# Patient Record
Sex: Female | Born: 1995 | Race: White | Hispanic: No | Marital: Married | Smoking: Former smoker
Health system: Southern US, Community
[De-identification: ages and names within clinical notes are randomized; demographics above are authoritative.]

## PROBLEM LIST (undated history)

## (undated) DIAGNOSIS — R51 Headache: Secondary | ICD-10-CM

## (undated) DIAGNOSIS — N390 Urinary tract infection, site not specified: Secondary | ICD-10-CM

## (undated) DIAGNOSIS — M419 Scoliosis, unspecified: Secondary | ICD-10-CM

## (undated) DIAGNOSIS — F419 Anxiety disorder, unspecified: Secondary | ICD-10-CM

## (undated) DIAGNOSIS — K219 Gastro-esophageal reflux disease without esophagitis: Secondary | ICD-10-CM

## (undated) DIAGNOSIS — F988 Other specified behavioral and emotional disorders with onset usually occurring in childhood and adolescence: Secondary | ICD-10-CM

## (undated) DIAGNOSIS — F509 Eating disorder, unspecified: Secondary | ICD-10-CM

## (undated) DIAGNOSIS — F329 Major depressive disorder, single episode, unspecified: Secondary | ICD-10-CM

## (undated) DIAGNOSIS — F32A Depression, unspecified: Secondary | ICD-10-CM

## (undated) DIAGNOSIS — Z789 Other specified health status: Secondary | ICD-10-CM

## (undated) HISTORY — DX: Gastro-esophageal reflux disease without esophagitis: K21.9

## (undated) HISTORY — DX: Other specified behavioral and emotional disorders with onset usually occurring in childhood and adolescence: F98.8

---
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
## 2015-01-22 ENCOUNTER — Other Ambulatory Visit: Payer: BLUE CROSS/BLUE SHIELD

## 2015-01-22 DIAGNOSIS — Z0000 Encounter for general adult medical examination without abnormal findings: Secondary | ICD-10-CM

## 2015-01-23 LAB — CBC WITH DIFFERENTIAL
Basophils Absolute: 0 10*3/uL (ref 0.0–0.2)
Basos: 0 %
Eos: 0 %
Eosinophils Absolute: 0 10*3/uL (ref 0.0–0.4)
HCT: 41.4 % (ref 34.0–46.6)
Hemoglobin: 13.4 g/dL (ref 11.1–15.9)
Immature Grans (Abs): 0 10*3/uL (ref 0.0–0.1)
Immature Granulocytes: 0 %
Lymphocytes Absolute: 3.4 10*3/uL — ABNORMAL HIGH (ref 0.7–3.1)
Lymphs: 41 %
MCH: 29.2 pg (ref 26.6–33.0)
MCHC: 32.4 g/dL (ref 31.5–35.7)
MCV: 90 fL (ref 79–97)
Monocytes Absolute: 0.5 10*3/uL (ref 0.1–0.9)
Monocytes: 7 %
Neutrophils Absolute: 4.2 10*3/uL (ref 1.4–7.0)
Neutrophils Relative %: 52 %
RBC: 4.59 x10E6/uL (ref 3.77–5.28)
RDW: 13.5 % (ref 12.3–15.4)
WBC: 8.2 10*3/uL (ref 3.4–10.8)

## 2015-01-23 LAB — COMPREHENSIVE METABOLIC PANEL
ALT: 12 IU/L (ref 0–32)
AST: 19 IU/L (ref 0–40)
Albumin/Globulin Ratio: 1.7 (ref 1.1–2.5)
Albumin: 4.6 g/dL (ref 3.5–5.5)
Alkaline Phosphatase: 67 IU/L (ref 43–101)
BUN/Creatinine Ratio: 14 (ref 8–20)
BUN: 10 mg/dL (ref 6–20)
Bilirubin Total: 0.3 mg/dL (ref 0.0–1.2)
CO2: 20 mmol/L (ref 18–29)
Calcium: 9.8 mg/dL (ref 8.7–10.2)
Chloride: 102 mmol/L (ref 97–108)
Creatinine, Ser: 0.72 mg/dL (ref 0.57–1.00)
GFR calc Af Amer: 141 mL/min/{1.73_m2} (ref >59)
GFR calc non Af Amer: 123 mL/min/{1.73_m2} (ref >59)
Globulin, Total: 2.7 g/dL (ref 1.5–4.5)
Glucose: 83 mg/dL (ref 65–99)
Potassium: 4.3 mmol/L (ref 3.5–5.2)
Sodium: 141 mmol/L (ref 134–144)
Total Protein: 7.3 g/dL (ref 6.0–8.5)

## 2015-01-23 LAB — LIPID PANEL
Chol/HDL Ratio: 2 ratio units (ref 0.0–4.4)
Cholesterol, Total: 146 mg/dL (ref 100–169)
HDL: 73 mg/dL (ref >39)
LDL Calculated: 61 mg/dL (ref 0–109)
Triglycerides: 59 mg/dL (ref 0–89)
VLDL Cholesterol Cal: 12 mg/dL (ref 5–40)

## 2015-01-24 ENCOUNTER — Ambulatory Visit (INDEPENDENT_AMBULATORY_CARE_PROVIDER_SITE_OTHER): Payer: BLUE CROSS/BLUE SHIELD | Admitting: Internal Medicine

## 2015-01-24 ENCOUNTER — Encounter: Payer: Self-pay | Admitting: Internal Medicine

## 2015-01-24 VITALS — BP 102/74 | HR 101 | Temp 98.0°F | Resp 20 | Ht <= 58 in | Wt 125.4 lb

## 2015-01-24 DIAGNOSIS — N926 Irregular menstruation, unspecified: Secondary | ICD-10-CM | POA: Diagnosis not present

## 2015-01-24 DIAGNOSIS — Z7251 High risk heterosexual behavior: Secondary | ICD-10-CM | POA: Diagnosis not present

## 2015-01-24 DIAGNOSIS — R300 Dysuria: Secondary | ICD-10-CM

## 2015-01-24 DIAGNOSIS — F319 Bipolar disorder, unspecified: Secondary | ICD-10-CM

## 2015-01-24 LAB — POCT URINALYSIS DIPSTICK
Bilirubin, UA: NEGATIVE
Glucose, UA: NEGATIVE
Ketones, UA: NEGATIVE
Nitrite, UA: NEGATIVE
Protein, UA: NEGATIVE
Spec Grav, UA: 1.015
Urobilinogen, UA: 0.2
pH, UA: 7

## 2015-01-24 MED ORDER — NORGESTIMATE-ETH ESTRADIOL 0.25-35 MG-MCG PO TABS: 1.0000 | ORAL_TABLET | Freq: Every day | ORAL | Status: DC
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
## 2015-01-24 NOTE — Patient Instructions (Signed)

## 2015-01-25 LAB — HCG, SERUM, QUALITATIVE: hCG,Beta Subunit,Qual,Serum: NEGATIVE m[IU]/mL (ref <6)

## 2015-01-25 LAB — HEPATITIS PANEL, ACUTE
Hep A IgM: NEGATIVE
Hep B C IgM: NEGATIVE
Hep C Virus Ab: <0.1 s/co ratio (ref 0.0–0.9)
Hepatitis B Surface Ag: NEGATIVE

## 2015-01-25 LAB — HIV ANTIBODY (ROUTINE TESTING W REFLEX): HIV Screen 4th Generation wRfx: NONREACTIVE

## 2015-01-26 LAB — URINE CULTURE

## 2015-01-27 LAB — GC/CHLAMYDIA PROBE AMP
Chlamydia trachomatis, NAA: POSITIVE — AB
Neisseria gonorrhoeae by PCR: NEGATIVE

## 2015-01-29 ENCOUNTER — Telehealth: Payer: Self-pay

## 2015-01-29 MED ORDER — AZITHROMYCIN 1 G PO PACK: 1.0000 g | PACK | Freq: Once | ORAL | Status: DC

## 2015-01-29 NOTE — Telephone Encounter (Signed)
-----   Message from Kermit Baloiffany L Reed, DO sent at 01/27/2015  1:23 PM EDT ----- Please call Chelsea Thomas and inform her:  Chlamydia test was positive.  She can take zithromax 1000mg  once to treat this.  If she does not take it properly, she can develop pelvic inflammatory disease which can be very dangerous.  I will need to retest her in 3 mos at her follow up appt.  All partners with whom she had unprotected sex should also be tested and treated appropriately.  Gonorrhea test was negative. Urine culture was negative.

## 2015-01-29 NOTE — Telephone Encounter (Signed)
Spoke with patient, patient verbalized understanding of results. RX sent to the pharmacy. Patient aware to sign DPR if ok to release information to mother.

## 2015-02-04 ENCOUNTER — Telehealth: Payer: Self-pay | Admitting: *Deleted

## 2015-02-04 NOTE — Telephone Encounter (Signed)
[facility] called and stated that she had received test results from patient's last testing and wanted to know if she has been treated and the date of treatment. Gave her the information.

## 2015-04-26 ENCOUNTER — Ambulatory Visit: Payer: BLUE CROSS/BLUE SHIELD | Admitting: Internal Medicine

## 2015-05-24 ENCOUNTER — Ambulatory Visit (INDEPENDENT_AMBULATORY_CARE_PROVIDER_SITE_OTHER): Payer: BLUE CROSS/BLUE SHIELD | Admitting: Internal Medicine

## 2015-05-24 ENCOUNTER — Encounter: Payer: Self-pay | Admitting: Internal Medicine

## 2015-05-24 VITALS — BP 116/70 | HR 74 | Temp 98.3°F | Wt 117.0 lb

## 2015-05-24 DIAGNOSIS — G43119 Migraine with aura, intractable, without status migrainosus: Secondary | ICD-10-CM

## 2015-05-24 DIAGNOSIS — M6248 Contracture of muscle, other site: Secondary | ICD-10-CM | POA: Diagnosis not present

## 2015-05-24 DIAGNOSIS — Z720 Tobacco use: Secondary | ICD-10-CM | POA: Diagnosis not present

## 2015-05-24 DIAGNOSIS — M62838 Other muscle spasm: Secondary | ICD-10-CM

## 2015-05-24 MED ORDER — ZOLMITRIPTAN 2.5 MG PO TABS: 2.5000 mg | ORAL_TABLET | Freq: Once | ORAL | Status: DC

## 2015-05-24 NOTE — Patient Instructions (Signed)
Try the zomig for the migraine.  If you are taking more than one tablet per week, please call back and I will refer you to the headache specialist.  Migraine Headache A migraine headache is an intense, throbbing pain on one or both sides of your head. A migraine can last for 30 minutes to several hours. CAUSES  The exact cause of a migraine headache is not always known. However, a migraine may be caused when nerves in the brain become irritated and release chemicals that cause inflammation. This causes pain. Certain things may also trigger migraines, such as:  Alcohol.  Smoking.  Stress.  Menstruation.  Aged cheeses.  Foods or drinks that contain nitrates, glutamate, aspartame, or tyramine.  Lack of sleep.  Chocolate.  Caffeine.  Hunger.  Physical exertion.  Fatigue.  Medicines used to treat chest pain (nitroglycerine), birth control pills, estrogen, and some blood pressure medicines. SIGNS AND SYMPTOMS  Pain on one or both sides of your head.  Pulsating or throbbing pain.  Severe pain that prevents daily activities.  Pain that is aggravated by any physical activity.  Nausea, vomiting, or both.  Dizziness.  Pain with exposure to bright lights, loud noises, or activity.  General sensitivity to bright lights, loud noises, or smells. Before you get a migraine, you may get warning signs that a migraine is coming (aura). An aura may include:  Seeing flashing lights.  Seeing bright spots, halos, or zigzag lines.  Having tunnel vision or blurred vision.  Having feelings of numbness or tingling.  Having trouble talking.  Having muscle weakness. DIAGNOSIS  A migraine headache is often diagnosed based on:  Symptoms.  Physical exam.  A CT scan or MRI of your head. These imaging tests cannot diagnose migraines, but they can help rule out other causes of headaches. TREATMENT Medicines may be given for pain and nausea. Medicines can also be given to help  prevent recurrent migraines.  HOME CARE INSTRUCTIONS  Only take over-the-counter or prescription medicines for pain or discomfort as directed by your health care provider. The use of long-term narcotics is not recommended.  Lie down in a dark, quiet room when you have a migraine.  Keep a journal to find out what may trigger your migraine headaches. For example, write down:  What you eat and drink.  How much sleep you get.  Any change to your diet or medicines.  Limit alcohol consumption.  Quit smoking if you smoke.  Get 7-9 hours of sleep, or as recommended by your health care provider.  Limit stress.  Keep lights dim if bright lights bother you and make your migraines worse. SEEK IMMEDIATE MEDICAL CARE IF:   Your migraine becomes severe.  You have a fever.  You have a stiff neck.  You have vision loss.  You have muscular weakness or loss of muscle control.  You start losing your balance or have trouble walking.  You feel faint or pass out.  You have severe symptoms that are different from your first symptoms. MAKE SURE YOU:   Understand these instructions.  Will watch your condition.  Will get help right away if you are not doing well or get worse. Document Released: 11/02/2005 Document Revised: 03/19/2014 Document Reviewed: 07/10/2013 [facility] Patient Information 2015 [employer] [place]. This information is not intended to replace advice given to you by your health care provider. Make sure you discuss any questions you have with your health care provider.
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
## 2015-06-04 ENCOUNTER — Ambulatory Visit (INDEPENDENT_AMBULATORY_CARE_PROVIDER_SITE_OTHER): Payer: BLUE CROSS/BLUE SHIELD | Admitting: Nurse Practitioner

## 2015-06-04 ENCOUNTER — Encounter: Payer: Self-pay | Admitting: Nurse Practitioner

## 2015-06-04 VITALS — BP 120/82 | HR 85 | Temp 98.5°F | Resp 20 | Ht <= 58 in | Wt 117.0 lb

## 2015-06-04 DIAGNOSIS — H60391 Other infective otitis externa, right ear: Secondary | ICD-10-CM | POA: Diagnosis not present

## 2015-06-04 DIAGNOSIS — J329 Chronic sinusitis, unspecified: Secondary | ICD-10-CM

## 2015-06-04 MED ORDER — AMOXICILLIN-POT CLAVULANATE 875-125 MG PO TABS: 1.0000 | ORAL_TABLET | Freq: Two times a day (BID) | ORAL | Status: AC

## 2015-06-04 MED ORDER — NEOMYCIN-POLYMYXIN-HC 3.5-10000-1 OT SOLN: 4.0000 [drp] | Freq: Three times a day (TID) | OTIC | Status: AC

## 2015-06-04 NOTE — Progress Notes (Signed)
Patient ID: Chelsea Thomas, female   DOB: 06/30/1996, 19 y.o.   MRN: [id number]    PCP: Bufford SpikesEED, TIFFANY, DO  No Known Allergies  Chief Complaint  Patient presents with  . Acute Visit    Head cold strted about a week ago,stuffy nose, right ear cloged , runny nose , cough     HPI: Patient is a 19 y.o. female seen in the office today due to head cold for about 1 week. Really bad ear ache 1 week ago then started having sore throat and runny nose.  Now she is coughing and other ear is clogged and has lost her voice.  No fevers.  Has not gotten any better or worse- staying about the same Tired a lot of over the counter medication, theraflu, cough drops.  Cough and ear being stopped up is the worse.   Review of Systems:  Review of Systems  Constitutional: Positive for fatigue. Negative for fever, chills, activity change and appetite change.  HENT: Positive for congestion, ear pain, postnasal drip, rhinorrhea, sinus pressure and sore throat (improved). Negative for ear discharge, facial swelling, hearing loss, mouth sores and nosebleeds.   Eyes: Negative.   Respiratory: Negative for cough and shortness of breath.   Cardiovascular: Negative for chest pain and leg swelling.    Past Medical History  Diagnosis Date  . Depression   . Urinary tract infection   . Scoliosis   . Medical history non-contributory   . Anxiety   . Eating disorder     Pt. reports   . Headache(784.0)   . Attention deficit disorder   . GERD (gastroesophageal reflux disease)    History reviewed. No pertinent past surgical history. Social History:   reports that she has been smoking Cigarettes.  She has a 1 pack-year smoking history. She has never used smokeless tobacco. She reports that she drinks alcohol. She reports that she does not use illicit drugs.  Family History  Problem Relation Age of Onset  . Depression Mother   . Mental illness Mother   . Cancer Maternal Grandfather   . Diabetes Maternal  Grandfather   . Hypertension Maternal Grandfather   . Diabetes Cousin   . Hypertension Cousin   . Drug abuse Father   . Osteoporosis Mother   . Asperger's syndrome Brother   . Scoliosis Brother     Medications: Patient's Medications  New Prescriptions   No medications on file  Previous Medications   ETONOGESTREL (NEXPLANON) 68 MG IMPL IMPLANT    1 each by Subdermal route once.   ZOLMITRIPTAN (ZOMIG) 2.5 MG TABLET    Take 1 tablet (2.5 mg total) by mouth once. May repeat in 2 hours if headache persists or recurs.  Modified Medications   No medications on file  Discontinued Medications   No medications on file     Physical Exam:  Filed Vitals:   06/04/15 1323  BP: 120/82  Pulse: 85  Temp: 98.5 F (36.9 C)  TempSrc: Oral  Resp: 20  Height: 4\' 10"  (1.473 m)  Weight: 117 lb (53.071 kg)  SpO2: 96%    Physical Exam  Constitutional: She is oriented to person, place, and time. No distress.  HENT:  Head: Normocephalic and atraumatic.  Right Ear: There is drainage (with erythema).  Nose: Nose normal.  Mouth/Throat: Oropharynx is clear and moist. No oropharyngeal exudate.  Pt is hoarse  Eyes: Conjunctivae and EOM are normal. Pupils are equal, round, and reactive to light.  Neck: Normal range of  motion. Neck supple.  Cardiovascular: Normal rate, regular rhythm and normal heart sounds.   Pulmonary/Chest: Effort normal and breath sounds normal.  Neurological: She is alert and oriented to person, place, and time.  Skin: Skin is warm and dry. She is not diaphoretic.  Psychiatric: She has a normal mood and affect.    Labs reviewed: Basic Metabolic Panel:  Recent Labs  16/10/96 1037  NA 141  K 4.3  CL 102  CO2 20  GLUCOSE 83  BUN 10  CREATININE 0.72  CALCIUM 9.8   Liver Function Tests:  Recent Labs  01/22/15 1037  AST 19  ALT 12  ALKPHOS 67  BILITOT 0.3  PROT 7.3   No results for input(s): LIPASE, AMYLASE in the last 8760 hours. No results for input(s):  AMMONIA in the last 8760 hours. CBC:  Recent Labs  01/22/15 1037  WBC 8.2  NEUTROABS 4.2  HGB 13.4  HCT 41.4  MCV 90   Lipid Panel:  Recent Labs  01/22/15 1037  CHOL 146  HDL 73  LDLCALC 61  TRIG 59  CHOLHDL 2.0   TSH: No results for input(s): TSH in the last 8760 hours. A1C: Lab Results  Component Value Date   HGBA1C 5.0 03/07/2014     Assessment/Plan 1. Sinusitis, unspecified chronicity, unspecified location -may use Claritin 10 mg daily -Delsym twice daily as needed for cough  - amoxicillin-clavulanate (AUGMENTIN) 875-125 MG per tablet; Take 1 tablet by mouth 2 (two) times daily.  Dispense: 14 tablet; Refill: 0  2. Otitis, externa, infective, right - neomycin-polymyxin-hydrocortisone (CORTISPORIN) otic solution; Place 4 drops into the right ear 3 (three) times daily.  Dispense: 10 mL; Refill: 0  Rashida Ladouceur K. Biagio Borg  [facility] [phone] 8 am - 5 pm) [phone] (after hours)

## 2015-06-04 NOTE — Patient Instructions (Signed)
Ear drops into right ear three times daily for 1 week To take Augmentin twice daily for 1 week  Use Claritin 10 mg daily Delsym twice daily as needed for cough   Sinusitis Sinusitis is redness, soreness, and inflammation of the paranasal sinuses. Paranasal sinuses are air pockets within the bones of your face (beneath the eyes, the middle of the forehead, or above the eyes). In healthy paranasal sinuses, mucus is able to drain out, and air is able to circulate through them by way of your nose. However, when your paranasal sinuses are inflamed, mucus and air can become trapped. This can allow bacteria and other germs to grow and cause infection. Sinusitis can develop quickly and last only a short time (acute) or continue over a long period (chronic). Sinusitis that lasts for more than 12 weeks is considered chronic.  CAUSES  Causes of sinusitis include:  Allergies.  Structural abnormalities, such as displacement of the cartilage that separates your nostrils (deviated septum), which can decrease the air flow through your nose and sinuses and affect sinus drainage.  Functional abnormalities, such as when the small hairs (cilia) that line your sinuses and help remove mucus do not work properly or are not present. SIGNS AND SYMPTOMS  Symptoms of acute and chronic sinusitis are the same. The primary symptoms are pain and pressure around the affected sinuses. Other symptoms include:  Upper toothache.  Earache.  Headache.  Bad breath.  Decreased sense of smell and taste.  A cough, which worsens when you are lying flat.  Fatigue.  Fever.  Thick drainage from your nose, which often is green and may contain pus (purulent).  Swelling and warmth over the affected sinuses. DIAGNOSIS  Your health care provider will perform a physical exam. During the exam, your health care provider may:  Look in your nose for signs of abnormal growths in your nostrils (nasal polyps).  Tap over the  affected sinus to check for signs of infection.  View the inside of your sinuses (endoscopy) using an imaging device that has a light attached (endoscope). If your health care provider suspects that you have chronic sinusitis, one or more of the following tests may be recommended:  Allergy tests.  Nasal culture. A sample of mucus is taken from your nose, sent to a lab, and screened for bacteria.  Nasal cytology. A sample of mucus is taken from your nose and examined by your health care provider to determine if your sinusitis is related to an allergy. TREATMENT  Most cases of acute sinusitis are related to a viral infection and will resolve on their own within 10 days. Sometimes medicines are prescribed to help relieve symptoms (pain medicine, decongestants, nasal steroid sprays, or saline sprays).  However, for sinusitis related to a bacterial infection, your health care provider will prescribe antibiotic medicines. These are medicines that will help kill the bacteria causing the infection.  Rarely, sinusitis is caused by a fungal infection. In theses cases, your health care provider will prescribe antifungal medicine. For some cases of chronic sinusitis, surgery is needed. Generally, these are cases in which sinusitis recurs more than 3 times per year, despite other treatments. HOME CARE INSTRUCTIONS   Drink plenty of water. Water helps thin the mucus so your sinuses can drain more easily.  Use a humidifier.  Inhale steam 3 to 4 times a day (for example, sit in the bathroom with the shower running).  Apply a warm, moist washcloth to your face 3 to 4 times  a day, or as directed by your health care provider.  Use saline nasal sprays to help moisten and clean your sinuses.  Take medicines only as directed by your health care provider.  If you were prescribed either an antibiotic or antifungal medicine, finish it all even if you start to feel better. SEEK IMMEDIATE MEDICAL CARE IF:  You  have increasing pain or severe headaches.  You have nausea, vomiting, or drowsiness.  You have swelling around your face.  You have vision problems.  You have a stiff neck.  You have difficulty breathing. MAKE SURE YOU:   Understand these instructions.  Will watch your condition.  Will get help right away if you are not doing well or get worse. Document Released: 11/02/2005 Document Revised: 03/19/2014 Document Reviewed: 11/17/2011 [facility] Patient Information 2015 [place], [place]. This information is not intended to replace advice given to you by your health care provider. Make sure you discuss any questions you have with your health care provider.

## 2015-09-19 ENCOUNTER — Encounter: Payer: Self-pay | Admitting: Internal Medicine

## 2015-09-19 ENCOUNTER — Ambulatory Visit: Payer: BLUE CROSS/BLUE SHIELD | Admitting: Internal Medicine

## 2015-09-30 ENCOUNTER — Ambulatory Visit (INDEPENDENT_AMBULATORY_CARE_PROVIDER_SITE_OTHER): Payer: BLUE CROSS/BLUE SHIELD | Admitting: Internal Medicine

## 2015-09-30 ENCOUNTER — Encounter: Payer: Self-pay | Admitting: Internal Medicine

## 2015-09-30 VITALS — BP 112/82 | HR 130 | Temp 98.4°F | Resp 18 | Ht <= 58 in | Wt 121.2 lb

## 2015-09-30 DIAGNOSIS — N898 Other specified noninflammatory disorders of vagina: Secondary | ICD-10-CM

## 2015-09-30 DIAGNOSIS — R000 Tachycardia, unspecified: Secondary | ICD-10-CM | POA: Diagnosis not present

## 2015-09-30 DIAGNOSIS — Z23 Encounter for immunization: Secondary | ICD-10-CM | POA: Diagnosis not present

## 2015-09-30 DIAGNOSIS — B081 Molluscum contagiosum: Secondary | ICD-10-CM | POA: Diagnosis not present

## 2015-09-30 DIAGNOSIS — Z7251 High risk heterosexual behavior: Secondary | ICD-10-CM | POA: Diagnosis not present

## 2015-09-30 MED ORDER — AZITHROMYCIN 1 G PO PACK: 1.0000 g | PACK | Freq: Once | ORAL | Status: DC

## 2015-09-30 NOTE — Progress Notes (Signed)
Patient ID: Chelsea Thomas, female   DOB: Nov 12, 1996, 19 y.o.   MRN: [id number]    Location: [facility] Provider: Gwenith Spitz. Renato Gails, D.O., C.M.D.  Code Status: full code Goals of Care: Advanced Directive information Does patient have an advance directive?: No  Chief Complaint  Patient presents with  . Acute Visit    Patients c/o Check for STDs and receive flu shot    HPI: Patient is a 19 y.o. female seen in the office today for an acute visit for a check for stds and to receive her flu vaccine.  Chelsea Thomas reports that she has been having "a lot" of unprotected sexual intercourse.  She that on Fri, little bumps appeared on her posterior genital area. When she sits, it's excruciatingly painful especially on a hard surface.  It's also burning when she pees.  Sometimes when she is walking along or sitting, large volumes of whitish-yellow, thin, odorless discharge will come out of her vagina and soak through her underwear.  She has difficulty wiping due to pain.  She's had no fever or chills and denies abdominal pain.  She wants to be tested for STDs due to her unprotected sex.  Also, she's been noting some tightness of her chest when she coughs.  Her heart rate is elevated today, but she appears very anxious.  She says it takes very little activity for her pulse to go up.    Review of Systems:  Review of Systems  Constitutional: Negative for fever, chills, weight loss and malaise/fatigue.  HENT: Negative for congestion.   Respiratory: Positive for cough and shortness of breath. Negative for sputum production and wheezing.   Cardiovascular: Positive for palpitations.       Heart racing, chest pressure  Gastrointestinal: Negative for abdominal pain.  Genitourinary: Positive for dysuria. Negative for urgency, frequency, hematuria and flank pain.       Vaginal discharge and rash  Musculoskeletal: Negative for back pain.  Skin: Positive for rash. Negative for itching.       painful    Psychiatric/Behavioral: Positive for depression. The patient is nervous/anxious.     Past Medical History  Diagnosis Date  . Depression   . Urinary tract infection   . Scoliosis   . Medical history non-contributory   . Anxiety   . Eating disorder     Pt. reports   . Headache(784.0)   . Attention deficit disorder   . GERD (gastroesophageal reflux disease)     History reviewed. No pertinent past surgical history.  No Known Allergies    Medication List       This list is accurate as of: 09/30/15  5:55 PM.  Always use your most recent med list.               azithromycin 1 G powder  Commonly known as:  ZITHROMAX  Take 1 packet (1 g total) by mouth once.     NEXPLANON 68 MG Impl implant  Generic drug:  etonogestrel  1 each by Subdermal route once.     QUEtiapine 300 MG tablet  Commonly known as:  SEROQUEL  Take 300 mg by mouth daily.        Health Maintenance  Topic Date Due  . CHLAMYDIA SCREENING  03/07/2015  . TETANUS/TDAP  09/04/2015  . INFLUENZA VACCINE  06/16/2016  . HIV Screening  Completed    Physical Exam: Filed Vitals:   09/30/15 1557  BP: 112/82  Pulse: 130  Temp: 98.4 F (36.9 C)  TempSrc: Oral  Resp: 18  Height: 4\' 10"  (1.473 m)  Weight: 121 lb 3.2 oz (54.976 kg)  SpO2: 98%   Body mass index is 25.34 kg/(m^2). Physical Exam  Constitutional: She appears well-developed and well-nourished. No distress.  Cardiovascular: Regular rhythm and normal heart sounds.   tachycardic  Pulmonary/Chest: Effort normal and breath sounds normal. No respiratory distress. She has no wheezes.  Abdominal: Soft. Bowel sounds are normal.  Genitourinary:  Vagina with ulceration on pt's right side; some greenish yellow dried discharge on posterior aspect of vagina/labia minora; erythema of vaginal wall; very pale yellow/white thin discharge poured out of vagina when speculum was inserted, exam was very painful for her; over inferior aspect of buttocks and  posterior labia majora, several raised round punctate papules present with centers  Skin: Skin is warm and dry.  Psychiatric:  Anxious and jittery, tremulous    Labs reviewed: Basic Metabolic Panel:  Recent Labs  40/98/1102/07/01 1037  NA 141  K 4.3  CL 102  CO2 20  GLUCOSE 83  BUN 10  CREATININE 0.72  CALCIUM 9.8   Liver Function Tests:  Recent Labs  01/22/15 1037  AST 19  ALT 12  ALKPHOS 67  BILITOT 0.3  PROT 7.3  ALBUMIN 4.6   No results for input(s): LIPASE, AMYLASE in the last 8760 hours. No results for input(s): AMMONIA in the last 8760 hours. CBC:  Recent Labs  01/22/15 1037  WBC 8.2  NEUTROABS 4.2  HGB 13.4  HCT 41.4  MCV 90   Lipid Panel:  Recent Labs  01/22/15 1037  CHOL 146  HDL 73  LDLCALC 61  TRIG 59  CHOLHDL 2.0   Lab Results  Component Value Date   HGBA1C 5.0 03/07/2014     Assessment/Plan 1. Molluscum contagiosum infection - appears that is what lesions are on her buttocks and labia -referred to derm for topical therapy - Ambulatory referral to Dermatology - HIV antibody  2. Vaginal discharge - suspect gonorrhea or chlamydia and hoping she does not have PID (no abdominal/pelvic pain or fever, but is tachy) -swab sent and treated prophylactically due to large amount of discharge and high suspicion - GC/Chlamydia Probe Amp - HIV antibody - azithromycin (ZITHROMAX) 1 G powder; Take 1 packet (1 g total) by mouth once.  Dispense: 1 each; Refill: 0  3. History of unprotected sex - will screen for common offenders and check HIV due to molluscum being more common in immunocompromised adults -counseled on avoiding unprotected sex and presently any sex as she's contagious and given information about molluscum and stds - Hep C Antibody - GC/Chlamydia Probe Amp - HIV antibody  4. Sinus tachycardia (HCC) -suspect this is due to her anxiety and poorly controlled bipolar disorder plus her current worries about her possible STDs  5. Need  for prophylactic vaccination and inoculation against influenza - Flu Vaccine QUAD 36+ mos PF IM (Fluarix & Fluzone Quad PF) given   Labs/tests ordered:   Orders Placed This Encounter  Procedures  . GC/Chlamydia Probe Amp  . Flu Vaccine QUAD 36+ mos PF IM (Fluarix & Fluzone Quad PF)  . Hep C Antibody  . HIV antibody  . Ambulatory referral to Dermatology    Referral Priority:  Urgent    Referral Type:  Consultation    Referral Reason:  Specialty Services Required    Requested Specialty:  Dermatology    Number of Visits Requested:  1    Next appt:  F/u 8 wks, but  pt left w/o making her appt.  Olander Friedl L. Billyjoe Go, D.O. Geriatrics [facility] [address], [place] [address] Cell Phone (Mon-Fri 8am-5pm):  [phone] On Call:  [phone] & follow prompts after 5pm & weekends Office Phone:  [phone] Office Fax:  [phone]

## 2015-09-30 NOTE — Patient Instructions (Signed)
Sexually Transmitted Disease A sexually transmitted disease (STD) is a disease or infection that may be passed (transmitted) from person to person, usually during sexual activity. This may happen by way of saliva, semen, blood, vaginal mucus, or urine. Common STDs include:  Gonorrhea.  Chlamydia.  Syphilis.  HIV and AIDS.  Genital herpes.  Hepatitis B and C.  Trichomonas.  Human papillomavirus (HPV).  Pubic lice.  Scabies.  Mites.  Bacterial vaginosis. WHAT ARE CAUSES OF STDs? An STD may be caused by bacteria, a virus, or parasites. STDs are often transmitted during sexual activity if one person is infected. However, they may also be transmitted through nonsexual means. STDs may be transmitted after:   Sexual intercourse with an infected person.  Sharing sex toys with an infected person.  Sharing needles with an infected person or using unclean piercing or tattoo needles.  Having intimate contact with the genitals, mouth, or rectal areas of an infected person.  Exposure to infected fluids during birth. WHAT ARE THE SIGNS AND SYMPTOMS OF STDs? Different STDs have different symptoms. Some people may not have any symptoms. If symptoms are present, they may include:  Painful or bloody urination.  Pain in the pelvis, abdomen, vagina, anus, throat, or eyes.  A skin rash, itching, or irritation.  Growths, ulcerations, blisters, or sores in the genital and anal areas.  Abnormal vaginal discharge with or without bad odor.  Penile discharge in men.  Fever.  Pain or bleeding during sexual intercourse.  Swollen glands in the groin area.  Yellow skin and eyes (jaundice). This is seen with hepatitis.  Swollen testicles.  Infertility.  Sores and blisters in the mouth. HOW ARE STDs DIAGNOSED? To make a diagnosis, your health care provider may:  Take a medical history.  Perform a physical exam.  Take a sample of any discharge to examine.  Swab the throat,  cervix, opening to the penis, rectum, or vagina for testing.  Test a sample of your first morning urine.  Perform blood tests.  Perform a Pap test, if this applies.  Perform a colposcopy.  Perform a laparoscopy. HOW ARE STDs TREATED? Treatment depends on the STD. Some STDs may be treated but not cured.  Chlamydia, gonorrhea, trichomonas, and syphilis can be cured with antibiotic medicine.  Genital herpes, hepatitis, and HIV can be treated, but not cured, with prescribed medicines. The medicines lessen symptoms.  Genital warts from HPV can be treated with medicine or by freezing, burning (electrocautery), or surgery. Warts may come back.  HPV cannot be cured with medicine or surgery. However, abnormal areas may be removed from the cervix, vagina, or vulva.  If your diagnosis is confirmed, your recent sexual partners need treatment. This is true even if they are symptom-free or have a negative culture or evaluation. They should not have sex until their health care providers say it is okay.  Your health care provider may test you for infection again 3 months after treatment. HOW CAN I REDUCE MY RISK OF GETTING AN STD? Take these steps to reduce your risk of getting an STD:  Use latex condoms, dental dams, and water-soluble lubricants during sexual activity. Do not use petroleum jelly or oils.  Avoid having multiple sex partners.  Do not have sex with someone who has other sex partners  Do not have sex with anyone you do not know or who is at high risk for an STD.  Avoid risky sex practices that can break your skin.  Do not have sex  if you have open sores on your mouth or skin.  Avoid drinking too much alcohol or taking illegal drugs. Alcohol and drugs can affect your judgment and put you in a vulnerable position.  Avoid engaging in oral and anal sex acts.  Get vaccinated for HPV and hepatitis. If you have not received these vaccines in the past, talk to your health care  provider about whether one or both might be right for you.  If you are at risk of being infected with HIV, it is recommended that you take a prescription medicine daily to prevent HIV infection. This is called pre-exposure prophylaxis (PrEP). You are considered at risk if:  You are a man who has sex with other men (MSM).  You are a heterosexual man or woman and are sexually active with more than one partner.  You take drugs by injection.  You are sexually active with a partner who has HIV.  Talk with your health care provider about whether you are at high risk of being infected with HIV. If you choose to begin PrEP, you should first be tested for HIV. You should then be tested every 3 months for as long as you are taking PrEP. WHAT SHOULD I DO IF I THINK I HAVE AN STD?  See your health care provider.  Tell your sexual partner(s). They should be tested and treated for any STDs.  Do not have sex until your health care provider says it is okay. WHEN SHOULD I GET IMMEDIATE MEDICAL CARE? Contact your health care provider right away if:   You have severe abdominal pain.  You are a man and notice swelling or pain in your testicles.  You are a woman and notice swelling or pain in your vagina.   This information is not intended to replace advice given to you by your health care provider. Make sure you discuss any questions you have with your health care provider.   Document Released: 01/23/2003 Document Revised: 11/23/2014 Document Reviewed: 05/23/2013 Elsevier Interactive Patient Education 2016 Elsevier Inc. Molluscum Contagiosum, Adult Molluscum contagiosum is a skin infection that can cause a rash. When molluscum contagiosum affects the genital area, it is called a sexually transmitted disease (STD). CAUSES Molluscum contagiosum is caused by a virus. The virus can spread easily from person to person through:  Skin-to-skin contact with an infected person.  Contact with an infected  object, such as a towel or clothing. RISK FACTORS You may be at higher risk for molluscum contagiosum if you:  Live in an area where the weather is moist and warm.  Have a weak body defense system (immune system). SIGNS AND SYMPTOMS The main symptom is a rash that appears 2-7 weeks after exposure to the virus. It is made up of 2-20 small, firm, dome-shaped bumps that may:  Be pink or flesh-colored.  Appear alone or in groups.  Range from the size of a pinhead to the size of a pencil eraser.  Feel smooth and waxy.  Have a pit in the middle.  Itch. The rash does not itch for most people. The bumps often appears on the genitals, thighs, face, neck, and abdomen. DIAGNOSIS  A health care provider can usually diagnose molluscum contagiosum by looking at the bumps on your skin. To confirm the diagnosis, your health care provider may scrape the bumps to collect a skin sample to examine under a microscope. TREATMENT The bumps may go away on their own, but people often have treatment to keep the virus  from infecting someone else or to keep the rash from spreading to other body parts. Treatment may include:  Surgery to remove the bumps by freezing them (cryosurgery).  A procedure to scrape off the bumps (curettage).  A procedure to remove the bumps with a laser.  Putting medicine on the bumps (topical treatment). Sometimes no treatment is needed.  HOME CARE INSTRUCTIONS  Take medicines only as directed by your health care provider.  As long as you have bumps on your skin, the infection can spread to others and to other parts of your body. To prevent this from happening:  Do not scratch the bumps.  Do not share clothing or towels with others until the bumps disappear.   Avoid close contact with others until the bumps disappear. This includes sexual contact.  Wash your hands often.  Cover the bumps with clothing or a bandage when you will be near other people. SEEK MEDICAL CARE  IF:  The bumps are spreading.  The bumps are becoming red and sore.  The bumps have not gone away after 12 months.   This information is not intended to replace advice given to you by your health care provider. Make sure you discuss any questions you have with your health care provider.   Document Released: 05/30/2014 Document Reviewed: 05/30/2014 Elsevier Interactive Patient Education [employer]

## 2015-10-01 LAB — HIV ANTIBODY (ROUTINE TESTING W REFLEX): HIV Screen 4th Generation wRfx: NONREACTIVE

## 2015-10-01 LAB — HIV-1 RNA ULTRAQUANT REFLEX TO GENTYP+

## 2015-10-01 LAB — GC/CHLAMYDIA PROBE AMP
Chlamydia trachomatis, NAA: NEGATIVE
Neisseria gonorrhoeae by PCR: NEGATIVE

## 2015-10-01 LAB — HEPATITIS C ANTIBODY: Hep C Virus Ab: <0.1 s/co ratio (ref 0.0–0.9)

## 2015-10-02 ENCOUNTER — Telehealth: Payer: Self-pay

## 2015-10-02 NOTE — Telephone Encounter (Signed)
Discussed results below with patient's mother Derrill MemoPam. Pam verbalized understanding of results. Pam will have Morrie Sheldonshley call to answer question about discharge. Patient just picked up ABX yesterday. Pam will call Dr.Holland (GYN) and Dr.Lupton ([facility]) for appointments.   Gonorrhea and chlamydia tests are also negative. Is her discharge improving at all since taking the antibiotic? If it's not by midweek, she needs to see a gynecologist anyway.

## 2016-01-13 ENCOUNTER — Ambulatory Visit: Payer: BLUE CROSS/BLUE SHIELD | Admitting: Internal Medicine

## 2016-05-18 ENCOUNTER — Ambulatory Visit: Payer: BLUE CROSS/BLUE SHIELD | Admitting: Internal Medicine

## 2016-11-05 ENCOUNTER — Ambulatory Visit: Payer: BLUE CROSS/BLUE SHIELD | Admitting: Internal Medicine

## 2017-01-08 ENCOUNTER — Encounter: Payer: Self-pay | Admitting: Internal Medicine

## 2017-01-08 ENCOUNTER — Ambulatory Visit (INDEPENDENT_AMBULATORY_CARE_PROVIDER_SITE_OTHER): Payer: BLUE CROSS/BLUE SHIELD | Admitting: Internal Medicine

## 2017-01-08 VITALS — BP 110/80 | HR 74 | Temp 98.2°F | Wt 120.0 lb

## 2017-01-08 DIAGNOSIS — Z7251 High risk heterosexual behavior: Secondary | ICD-10-CM

## 2017-01-08 DIAGNOSIS — R102 Pelvic and perineal pain: Secondary | ICD-10-CM | POA: Diagnosis not present

## 2017-01-08 DIAGNOSIS — N898 Other specified noninflammatory disorders of vagina: Secondary | ICD-10-CM | POA: Diagnosis not present
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
## 2017-01-08 NOTE — Patient Instructions (Signed)
Always use a condom when having sexual intercourse. You should ask your man to be tested first. I recommend you follow with a gynecologist for these needs.

## 2017-01-09 LAB — HEPATITIS C ANTIBODY: HCV Ab: NEGATIVE

## 2017-01-09 LAB — HERPES SIMPLEX VIRUS 1/2 (IGG), CSF

## 2017-01-09 LAB — GC/CHLAMYDIA PROBE AMP
CT Probe RNA: DETECTED — AB
GC Probe RNA: NOT DETECTED

## 2017-01-10 ENCOUNTER — Other Ambulatory Visit: Payer: Self-pay | Admitting: Internal Medicine

## 2017-01-10 DIAGNOSIS — A749 Chlamydial infection, unspecified: Secondary | ICD-10-CM

## 2017-01-10 MED ORDER — DOXYCYCLINE HYCLATE 100 MG PO TABS: 100.0000 mg | ORAL_TABLET | Freq: Two times a day (BID) | ORAL | 0 refills | Status: AC

## 2017-01-14 LAB — HSV 1/2 AB (IGM), IFA W/RFLX TITER
HSV 1 IgM Screen: NEGATIVE
HSV 2 IgM Screen: NEGATIVE

## 2017-01-29 ENCOUNTER — Telehealth: Payer: Self-pay

## 2017-01-29 NOTE — Telephone Encounter (Signed)
Message left on clinical intake voicemail:   Patient was calling to follow-up on letter mailed to her on 01/20/17 as a results of several attempts to reach patient about additional lab results and to schedule nurse visit appointment for last gardisil injection.  I called patient back, phone rung continuously, no voicemail pick-up. I will try again later

## 2017-02-01 NOTE — Telephone Encounter (Signed)
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

## 2017-02-04 ENCOUNTER — Ambulatory Visit: Payer: Self-pay

## 2017-02-05 ENCOUNTER — Ambulatory Visit: Payer: Self-pay

## 2017-02-11 ENCOUNTER — Ambulatory Visit: Payer: Self-pay

## 2018-05-23 ENCOUNTER — Encounter (HOSPITAL_COMMUNITY): Payer: Self-pay

## 2018-05-23 ENCOUNTER — Other Ambulatory Visit: Payer: Self-pay

## 2018-05-23 ENCOUNTER — Emergency Department (HOSPITAL_COMMUNITY)
Admission: EM | Admit: 2018-05-23 | Discharge: 2018-05-23 | Disposition: A | Discharge status: Home / Self Care | Payer: BLUE CROSS/BLUE SHIELD | Attending: Emergency Medicine | Admitting: Emergency Medicine

## 2018-05-23 DIAGNOSIS — F17210 Nicotine dependence, cigarettes, uncomplicated: Secondary | ICD-10-CM | POA: Diagnosis not present

## 2018-05-23 DIAGNOSIS — T7421XA Adult sexual abuse, confirmed, initial encounter: Secondary | ICD-10-CM | POA: Diagnosis present

## 2018-05-23 LAB — COMPREHENSIVE METABOLIC PANEL
ALT: 14 U/L (ref 0–44)
AST: 20 U/L (ref 15–41)
Albumin: 4.5 g/dL (ref 3.5–5.0)
Alkaline Phosphatase: 41 U/L (ref 38–126)
Anion gap: 7 (ref 5–15)
BUN: 13 mg/dL (ref 6–20)
CO2: 27 mmol/L (ref 22–32)
Calcium: 9.4 mg/dL (ref 8.9–10.3)
Chloride: 107 mmol/L (ref 98–111)
Creatinine, Ser: 0.61 mg/dL (ref 0.44–1.00)
GFR calc Af Amer: >60 mL/min (ref >60)
GFR calc non Af Amer: >60 mL/min (ref >60)
Glucose, Bld: 124 mg/dL — ABNORMAL HIGH (ref 70–99)
Potassium: 4.2 mmol/L (ref 3.5–5.1)
Sodium: 141 mmol/L (ref 135–145)
Total Bilirubin: 1 mg/dL (ref 0.3–1.2)
Total Protein: 7.6 g/dL (ref 6.5–8.1)

## 2018-05-23 LAB — RAPID HIV SCREEN (HIV 1/2 AB+AG)
HIV 1/2 Antibodies: NONREACTIVE
HIV-1 P24 Antigen - HIV24: NONREACTIVE

## 2018-05-23 LAB — GROUP A STREP BY PCR: Group A Strep by PCR: NOT DETECTED

## 2018-05-23 MED ORDER — METRONIDAZOLE 500 MG PO TABS
2000.0000 mg | ORAL_TABLET | Freq: Once | ORAL | Status: AC
  Administered 2018-05-23: 2000 mg via ORAL

## 2018-05-23 MED ORDER — PROMETHAZINE HCL 25 MG PO TABS
25.0000 mg | ORAL_TABLET | Freq: Four times a day (QID) | ORAL | Status: DC | PRN
  Administered 2018-05-23: 75 mg via ORAL

## 2018-05-23 MED ORDER — ELVITEG-COBIC-EMTRICIT-TENOFAF 150-150-200-10 MG PO TABS
1.0000 | ORAL_TABLET | Freq: Every day | ORAL | Status: DC
  Filled 2018-05-23: qty 5

## 2018-05-23 MED ORDER — AZITHROMYCIN 250 MG PO TABS
1000.0000 mg | ORAL_TABLET | Freq: Once | ORAL | Status: AC
  Administered 2018-05-23: 1000 mg via ORAL

## 2018-05-23 MED ORDER — ELVITEG-COBIC-EMTRICIT-TENOFAF 150-150-200-10 MG PO TABS: 1.0000 | ORAL_TABLET | Freq: Every day | ORAL | 0 refills | Status: DC

## 2018-05-23 MED ORDER — LIDOCAINE HCL (PF) 1 % IJ SOLN
0.9000 mL | Freq: Once | INTRAMUSCULAR | Status: AC
  Administered 2018-05-23: 2 mL

## 2018-05-23 MED ORDER — CEFTRIAXONE SODIUM 250 MG IJ SOLR
250.0000 mg | Freq: Once | INTRAMUSCULAR | Status: AC
  Administered 2018-05-23: 250 mg via INTRAMUSCULAR

## 2018-05-23 MED FILL — GENVOYA TABLET: 150-150-200 | 28 days supply | Qty: 28 | Fill #0
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
## 2018-05-23 NOTE — SANE Note (Addendum)
ON 05/23/2018, AT APPROXIMATELY 1634 HOURS, THE FOLLOWING ADVANCING ACCESS PROGRAM (AAP) CO-PAY ASSISTANCE VOUCHER NUMBERS WERE OBTAINED FOR THE HIV nPEP MEDICATION GENVOYA:    BIN:  [id number]  PCN:  [id number]  GROUP:  [id number]  ISSUER:  [id number]  ID:  [id number]  THE ABOVE VOUCHER NUMBERS WERE EMAILED TO THE [facility] ([facility]).  THE [facility] AND THE  INPATIENT PHARMACY ADVISED THAT THE ENTIRE 28-DAY REGIMEN WOULD BE HAND-DELIVERED TO THE PT, WHO WAS IN [facility] TRIAGE ROOM #4, FROM THE HIV nPEP SUPPLY OF GENVOYA AT THE [facility].  THE PT RECEIVED THE MEDICATION PRIOR TO BEING DISCHARGED.

## 2018-05-24 LAB — HEPATITIS C ANTIBODY: HCV Ab: <0.1 s/co ratio (ref 0.0–0.9)

## 2018-05-24 LAB — RPR: RPR Ser Ql: NONREACTIVE

## 2018-05-24 LAB — HEPATITIS B SURFACE ANTIGEN: Hepatitis B Surface Ag: NEGATIVE
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
## 2018-06-17 LAB — OB RESULTS CONSOLE GC/CHLAMYDIA
Chlamydia: NEGATIVE
Gonorrhea: NEGATIVE

## 2020-05-21 LAB — OB RESULTS CONSOLE RUBELLA ANTIBODY, IGM: Rubella: IMMUNE

## 2020-06-17 LAB — OB RESULTS CONSOLE GC/CHLAMYDIA
Chlamydia: NEGATIVE
Gonorrhea: NEGATIVE

## 2020-08-07 NOTE — Progress Notes (Deleted)
    SUBJECTIVE:   CHIEF COMPLAINT / HPI:   Pregnant?   Adderall, dextroamphetamine-amphetamine  Diclegis, vraylar, fluoxetine.    PERTINENT  PMH / PSH: ***  OBJECTIVE:   There were no vitals taken for this visit.  ***  ASSESSMENT/PLAN:   No problem-specific Assessment & Plan notes found for this encounter.     Sandre Kitty, MD [facility]

## 2020-08-08 ENCOUNTER — Ambulatory Visit: Payer: BLUE CROSS/BLUE SHIELD | Admitting: Family Medicine

## 2020-10-03 LAB — OB RESULTS CONSOLE HIV ANTIBODY (ROUTINE TESTING): HIV: NONREACTIVE

## 2020-12-11 LAB — OB RESULTS CONSOLE GBS: GBS: NEGATIVE

## 2020-12-16 LAB — OB RESULTS CONSOLE HEPATITIS B SURFACE ANTIGEN: Hepatitis B Surface Ag: NEGATIVE

## 2020-12-24 LAB — OB RESULTS CONSOLE GC/CHLAMYDIA
Chlamydia: POSITIVE
Gonorrhea: POSITIVE

## 2020-12-31 ENCOUNTER — Other Ambulatory Visit: Payer: Self-pay

## 2020-12-31 ENCOUNTER — Encounter (HOSPITAL_COMMUNITY): Payer: Self-pay | Admitting: Obstetrics and Gynecology

## 2020-12-31 ENCOUNTER — Inpatient Hospital Stay (HOSPITAL_COMMUNITY)
Admission: AD | Admit: 2020-12-31 | Discharge: 2020-12-31 | Disposition: A | Discharge status: Home / Self Care | Payer: Medicaid Other | Attending: Obstetrics and Gynecology | Admitting: Obstetrics and Gynecology

## 2020-12-31 DIAGNOSIS — Z0371 Encounter for suspected problem with amniotic cavity and membrane ruled out: Secondary | ICD-10-CM

## 2020-12-31 DIAGNOSIS — Z3493 Encounter for supervision of normal pregnancy, unspecified, third trimester: Secondary | ICD-10-CM | POA: Insufficient documentation

## 2020-12-31 DIAGNOSIS — Z3A39 39 weeks gestation of pregnancy: Secondary | ICD-10-CM | POA: Insufficient documentation

## 2020-12-31 LAB — POCT FERN TEST: POCT Fern Test: NEGATIVE

## 2020-12-31 NOTE — MAU Note (Signed)
Patient reports feeling something run down her leg around 1430 today, states it hasn't continued to come out since then.  Denies any VB/CTX.  Endorses + FM.

## 2020-12-31 NOTE — MAU Provider Note (Signed)
Event Date/Time   First Provider Initiated Contact with Patient 12/31/20 1559     S: Ms. Chelsea Thomas is a 25 y.o. G1P0 at [redacted]w[redacted]d  who presents to MAU today complaining of leaking of fluid x 1 episode at 1430 hours today. She denies continuous leaking of fluid. She denies vaginal bleeding. She denies contractions. She reports normal fetal movement.  She receives care with [facility].  O: BP 124/72   Pulse (!) 118   Temp 98 F (36.7 C)   Resp 15   Wt 73.1 kg   BMI 30.44 kg/m  GENERAL: Well-developed, well-nourished female in no acute distress.  HEAD: Normocephalic, atraumatic.  CHEST: Normal effort of breathing, regular heart rate ABDOMEN: Soft, nontender, gravid PELVIC: Normal external female genitalia. Vagina is pink and rugated. Cervix with normal contour, no lesions. Normal discharge.  Negative pooling.   Cervical exam: Closed   Fetal Monitoring: Baseline: 150 Variability: Mod Accelerations: 15 x 15 Decelerations: None Contractions: q 2-3, pain score 0/10  Results for orders placed or performed during the hospital encounter of 12/31/20 (from the past 24 hour(s))  Fern Test     Status: None   Collection Time: 12/31/20  5:03 PM  Result Value Ref Range   POCT Fern Test Negative = intact amniotic membranes      A: [facility] at [redacted]w[redacted]d  Membranes intact (negative pooling, negative fern)  P: Discharge home in stable condition  Calvert Cantor, [place] 12/31/2020 4:48 PM

## 2020-12-31 NOTE — Discharge Instructions (Signed)
First Stage of Labor Labor is your body's natural process of moving your baby and other structures, including the placenta and umbilical cord, out of your uterus. There are three stages of labor. How long each stage lasts is different for every woman. But certain events happen during each stage that are the same for everyone.  The first stage starts when true labor begins. This stage ends when your cervix, which is the opening from your uterus into your vagina, is completely open (dilated).  The second stage begins when your cervix is fully dilated and you start pushing. This stage ends when your baby is born.  The third stage is the delivery of the organ that nourished your baby during pregnancy (placenta). First stage of labor As your due date gets closer, you may start to notice certain physical changes that mean labor is going to start soon. You may feel that your baby has dropped lower into your pelvis. You may experience irregular, often painless, contractions that go away when you walk around or lie down (Braxton Hicks contractions). This is also called false labor. The first stage of labor begins when you start having contractions that come at regular (evenly spaced) intervals and your cervix starts to get thinner and wider in preparation for your baby to pass through. Birth care providers measure the dilation of your cervix in centimeters (cm). One centimeter is a little less than one-half of an inch. The first stage ends when your cervix is dilated to 10 cm. The first stage of labor is divided into three phases:  Early phase.  Active phase.  Transitional phase. The length of the first stage of labor varies. It may be longer if this is your first pregnancy. You may spend most of this stage at home trying to relax and stay comfortable. How does this affect me? During the first stage of labor, you will move through three phases. What happens in the early phase?  You will start to have  regular contractions that last 30-60 seconds. Contractions may come every 5-20 minutes. Keep track of your contractions and call your birth care provider.  Your water may break during this phase.  You may notice a clear or slightly bloody discharge of mucus (mucus plug) from your vagina.  Your cervix will dilate to 3-6 cm. What happens in the active phase? The active phase usually lasts 3-5 hours. You may go to the hospital or birth center around this time. During the active phase:  Your contractions will become stronger, longer, and more uncomfortable.  Your contractions may last 45-90 seconds and come every 3-5 minutes.  You may feel lower back pain.  Your birth care providers may examine your cervix and feel your belly to find the position of your baby.  You may have a monitor strapped to your belly to measure your contractions and your baby's heart rate.  You may start using your pain management options.  Your cervix may be dilated to 6 cm and may start to dilate more quickly. What happens in the transitional phase? The transitional phase typically lasts from 30 minutes to 2 hours. At the end of this phase, your cervix will be fully dilated to 10 cm. During the transitional phase:  Contractions will get stronger and longer.  Contractions may last 60-90 seconds and come less than 2 minutes apart.  You may feel hot flashes, chills, or nausea. How does this affect my baby? During the first stage of labor, your baby will   gradually move down into your birth canal. Follow these instructions at home and in the hospital or birth center:  When labor first begins, try to stay calm. You are still in the early phase. If it is night, try to get some sleep. If it is day, try to relax and save your energy. You may want to make some calls and get ready to go to the hospital or birth center.  When you are in the early phase, try these methods to help ease discomfort: ? Deep breathing and  muscle relaxation. ? Taking a walk. ? Taking a warm bath or shower.  Drink some fluids and have a light snack if you feel like it.  Keep track of your contractions.  Based on the plan you created with your birth care provider, call when your contractions indicate it is time.  If your water breaks, note the time, color, and odor of the fluid.  When you are in the active phase, do your breathing exercises and rely on your support people and your team of birth care providers.   Contact a health care provider if:  Your contractions are strong and regular.  You have lower back pain or cramping.  Your water breaks.  You lose your mucus plug. Get help right away if you:  Have a severe headache that does not go away.  Have changes in your vision.  Have severe pain in your upper belly.  Do not feel the baby move.  Have bright red bleeding. Summary  The first stage of labor starts when true labor begins, and it ends when your cervix is dilated to 10 cm.  The first stage of labor has three phases: early, active, and transitional.  Your baby moves into the birth canal during the first stage of labor.  You may have contractions that become stronger and longer. You may also lose your mucus plug and have your water break.  Call your birth care provider when your contractions are frequent and strong enough to go to the hospital or birth center. This information is not intended to replace advice given to you by your health care provider. Make sure you discuss any questions you have with your health care provider. Document Revised: 02/23/2019 Document Reviewed: 01/16/2018 Elsevier Patient Education  2021 Elsevier Inc.  

## 2021-01-03 ENCOUNTER — Inpatient Hospital Stay (EMERGENCY_DEPARTMENT_HOSPITAL)
Admission: EM | Admit: 2021-01-03 | Discharge: 2021-01-04 | Disposition: A | Discharge status: Home / Self Care | Payer: Medicaid Other | Source: Home / Self Care | Attending: Emergency Medicine | Admitting: Emergency Medicine

## 2021-01-03 ENCOUNTER — Encounter (HOSPITAL_COMMUNITY): Payer: Self-pay | Admitting: Obstetrics & Gynecology

## 2021-01-03 ENCOUNTER — Other Ambulatory Visit: Payer: Self-pay

## 2021-01-03 ENCOUNTER — Inpatient Hospital Stay (HOSPITAL_COMMUNITY)
Admission: AD | Admit: 2021-01-03 | Discharge: 2021-01-03 | Disposition: A | Discharge status: Home / Self Care | Payer: Medicaid Other | Attending: Obstetrics & Gynecology | Admitting: Obstetrics & Gynecology

## 2021-01-03 DIAGNOSIS — O471 False labor at or after 37 completed weeks of gestation: Secondary | ICD-10-CM | POA: Diagnosis present

## 2021-01-03 DIAGNOSIS — Z87891 Personal history of nicotine dependence: Secondary | ICD-10-CM | POA: Insufficient documentation

## 2021-01-03 DIAGNOSIS — Y92810 Car as the place of occurrence of the external cause: Secondary | ICD-10-CM | POA: Insufficient documentation

## 2021-01-03 DIAGNOSIS — O9A313 Physical abuse complicating pregnancy, third trimester: Secondary | ICD-10-CM | POA: Insufficient documentation

## 2021-01-03 DIAGNOSIS — Z3A39 39 weeks gestation of pregnancy: Secondary | ICD-10-CM | POA: Diagnosis not present

## 2021-01-03 DIAGNOSIS — Z3689 Encounter for other specified antenatal screening: Secondary | ICD-10-CM

## 2021-01-03 DIAGNOSIS — O368330 Maternal care for abnormalities of the fetal heart rate or rhythm, third trimester, not applicable or unspecified: Secondary | ICD-10-CM | POA: Insufficient documentation

## 2021-01-03 DIAGNOSIS — O26893 Other specified pregnancy related conditions, third trimester: Secondary | ICD-10-CM | POA: Diagnosis not present

## 2021-01-03 DIAGNOSIS — R519 Headache, unspecified: Secondary | ICD-10-CM | POA: Diagnosis not present

## 2021-01-03 DIAGNOSIS — O479 False labor, unspecified: Secondary | ICD-10-CM

## 2021-01-03 DIAGNOSIS — Z79899 Other long term (current) drug therapy: Secondary | ICD-10-CM | POA: Insufficient documentation

## 2021-01-03 DIAGNOSIS — O368130 Decreased fetal movements, third trimester, not applicable or unspecified: Secondary | ICD-10-CM

## 2021-01-03 LAB — CBC WITH DIFFERENTIAL/PLATELET
Abs Immature Granulocytes: 0.49 10*3/uL — ABNORMAL HIGH (ref 0.00–0.07)
Basophils Absolute: 0.1 10*3/uL (ref 0.0–0.1)
Basophils Relative: 0 %
Eosinophils Absolute: 0 10*3/uL (ref 0.0–0.5)
Eosinophils Relative: 0 %
HCT: 30 % — ABNORMAL LOW (ref 36.0–46.0)
Hemoglobin: 9.9 g/dL — ABNORMAL LOW (ref 12.0–15.0)
Immature Granulocytes: 3 %
Lymphocytes Relative: 16 %
Lymphs Abs: 2.9 10*3/uL (ref 0.7–4.0)
MCH: 26 pg (ref 26.0–34.0)
MCHC: 33 g/dL (ref 30.0–36.0)
MCV: 78.7 fL — ABNORMAL LOW (ref 80.0–100.0)
Monocytes Absolute: 1.1 10*3/uL — ABNORMAL HIGH (ref 0.1–1.0)
Monocytes Relative: 6 %
Neutro Abs: 13.6 10*3/uL — ABNORMAL HIGH (ref 1.7–7.7)
Neutrophils Relative %: 75 %
Platelets: 281 10*3/uL (ref 150–400)
RBC: 3.81 MIL/uL — ABNORMAL LOW (ref 3.87–5.11)
RDW: 15.3 % (ref 11.5–15.5)
WBC: 18.1 10*3/uL — ABNORMAL HIGH (ref 4.0–10.5)
nRBC: 0 % (ref 0.0–0.2)

## 2021-01-03 LAB — COMPREHENSIVE METABOLIC PANEL
ALT: 12 U/L (ref 0–44)
AST: 18 U/L (ref 15–41)
Albumin: 2.7 g/dL — ABNORMAL LOW (ref 3.5–5.0)
Alkaline Phosphatase: 188 U/L — ABNORMAL HIGH (ref 38–126)
Anion gap: 11 (ref 5–15)
BUN: <5 mg/dL — ABNORMAL LOW (ref 6–20)
CO2: 17 mmol/L — ABNORMAL LOW (ref 22–32)
Calcium: 8.5 mg/dL — ABNORMAL LOW (ref 8.9–10.3)
Chloride: 107 mmol/L (ref 98–111)
Creatinine, Ser: 0.53 mg/dL (ref 0.44–1.00)
GFR, Estimated: >60 mL/min (ref >60)
Glucose, Bld: 83 mg/dL (ref 70–99)
Potassium: 3.4 mmol/L — ABNORMAL LOW (ref 3.5–5.1)
Sodium: 135 mmol/L (ref 135–145)
Total Bilirubin: 1 mg/dL (ref 0.3–1.2)
Total Protein: 6.3 g/dL — ABNORMAL LOW (ref 6.5–8.1)

## 2021-01-03 MED ORDER — LACTATED RINGERS IV BOLUS
1000.0000 mL | Freq: Once | INTRAVENOUS | Status: AC
  Administered 2021-01-03: 1000 mL via INTRAVENOUS

## 2021-01-03 MED ORDER — ACETAMINOPHEN 325 MG PO TABS
650.0000 mg | ORAL_TABLET | Freq: Once | ORAL | Status: AC
  Administered 2021-01-04: 650 mg via ORAL
  Filled 2021-01-03: qty 2
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
## 2021-01-03 NOTE — Progress Notes (Signed)
[facility] followed up with Dr. Langston Masker via telephone regarding fetal heart rate remaining tachycardic. Orders given to transfer to MAU for continued monitoring. ED provider made aware.

## 2021-01-03 NOTE — Discharge Instructions (Signed)
Fetal Movement Counts Patient Name: ________________________________________________ Patient Due Date: ____________________  What is a fetal movement count? A fetal movement count is the number of times that you feel your baby move during a certain amount of time. This may also be called a fetal kick count. A fetal movement count is recommended for every pregnant woman. You may be asked to start counting fetal movements as early as week 28 of your pregnancy. Pay attention to when your baby is most active. You may notice your baby's sleep and wake cycles. You may also notice things that make your baby move more. You should do a fetal movement count:  When your baby is normally most active.  At the same time each day. A good time to count movements is while you are resting, after having something to eat and drink. How do I count fetal movements? 1. Find a quiet, comfortable area. Sit, or lie down on your side. 2. Write down the date, the start time and stop time, and the number of movements that you felt between those two times. Take this information with you to your health care visits. 3. Write down your start time when you feel the first movement. 4. Count kicks, flutters, swishes, rolls, and jabs. You should feel at least 10 movements. 5. You may stop counting after you have felt 10 movements, or if you have been counting for 2 hours. Write down the stop time. 6. If you do not feel 10 movements in 2 hours, contact your health care provider for further instructions. Your health care provider may want to do additional tests to assess your baby's well-being. Contact a health care provider if:  You feel fewer than 10 movements in 2 hours.  Your baby is not moving like he or she usually does. Date: ____________ Start time: ____________ Stop time: ____________ Movements: ____________ Date: ____________ Start time: ____________ Stop time: ____________ Movements: ____________ Date: ____________  Start time: ____________ Stop time: ____________ Movements: ____________ Date: ____________ Start time: ____________ Stop time: ____________ Movements: ____________ Date: ____________ Start time: ____________ Stop time: ____________ Movements: ____________ Date: ____________ Start time: ____________ Stop time: ____________ Movements: ____________ Date: ____________ Start time: ____________ Stop time: ____________ Movements: ____________ Date: ____________ Start time: ____________ Stop time: ____________ Movements: ____________ Date: ____________ Start time: ____________ Stop time: ____________ Movements: ____________ This information is not intended to replace advice given to you by your health care provider. Make sure you discuss any questions you have with your health care provider. Document Revised: 06/22/2019 Document Reviewed: 06/22/2019 Elsevier Patient Education  2021 Elsevier Inc. Signs and Symptoms of Labor Labor is the body's natural process of moving the baby and the placenta out of the uterus. The process of labor usually starts when the baby is full-term, between 37 and 40 weeks of pregnancy. Signs and symptoms that you are close to going into labor As your body prepares for labor and the birth of your baby, you may notice the following symptoms in the weeks and days before true labor starts:  Passing a small amount of thick, bloody mucus from your vagina. This is called normal bloody show or losing your mucus plug. This may happen more than a week before labor begins, or right before labor begins, as the opening of the cervix starts to widen (dilate). For some women, the entire mucus plug passes at once. For others, pieces of the mucus plug may gradually pass over several days.  Your baby moving (dropping) lower in your pelvis   to get into position for birth (lightening). When this happens, you may feel more pressure on your bladder and pelvic bone and less pressure on your ribs. This  may make it easier to breathe. It may also cause you to need to urinate more often and have problems with bowel movements.  Having "practice contractions," also called Braxton Hicks contractions or false labor. These occur at irregular (unevenly spaced) intervals that are more than 10 minutes apart. False labor contractions are common after exercise or sexual activity. They will stop if you change position, rest, or drink fluids. These contractions are usually mild and do not get stronger over time. They may feel like: ? A backache or back pain. ? Mild cramps, similar to menstrual cramps. ? Tightening or pressure in your abdomen. Other early symptoms include:  Nausea or loss of appetite.  Diarrhea.  Having a sudden burst of energy, or feeling very tired.  Mood changes.  Having trouble sleeping.   Signs and symptoms that labor has begun Signs that you are in labor may include:  Having contractions that come at regular (evenly spaced) intervals and increase in intensity. This may feel like more intense tightening or pressure in your abdomen that moves to your back. ? Contractions may also feel like rhythmic pain in your upper thighs or back that comes and goes at regular intervals. ? For first-time mothers, this change in intensity of contractions often occurs at a more gradual pace. ? Women who have given birth before may notice a more rapid progression of contraction changes.  Feeling pressure in the vaginal area.  Your water breaking (rupture of membranes). This is when the sac of fluid that surrounds your baby breaks. Fluid leaking from your vagina may be clear or blood-tinged. Labor usually starts within 24 hours of your water breaking, but it may take longer to begin. ? Some women may feel a sudden gush of fluid. ? Others notice that their underwear repeatedly becomes damp. Follow these instructions at home:  When labor starts, or if your water breaks, call your health care  provider or nurse care line. Based on your situation, they will determine when you should go in for an exam.  During early labor, you may be able to rest and manage symptoms at home. Some strategies to try at home include: ? Breathing and relaxation techniques. ? Taking a warm bath or shower. ? Listening to music. ? Using a heating pad on the lower back for pain. If you are directed to use heat:  Place a towel between your skin and the heat source.  Leave the heat on for 20-30 minutes.  Remove the heat if your skin turns bright red. This is especially important if you are unable to feel pain, heat, or cold. You may have a greater risk of getting burned.   Contact a health care provider if:  Your labor has started.  Your water breaks. Get help right away if:  You have painful, regular contractions that are 5 minutes apart or less.  Labor starts before you are [redacted] weeks along in your pregnancy.  You have a fever.  You have bright red blood coming from your vagina.  You do not feel your baby moving.  You have a severe headache with or without vision problems.  You have severe nausea, vomiting, or diarrhea.  You have chest pain or shortness of breath. These symptoms may represent a serious problem that is an emergency. Do not wait to see   if the symptoms will go away. Get medical help right away. Call your local emergency services (911 in the [place] Do not drive yourself to the hospital. Summary  Labor is your body's natural process of moving your baby and the placenta out of your uterus.  The process of labor usually starts when your baby is full-term, between 37 and 40 weeks of pregnancy.  When labor starts, or if your water breaks, call your health care provider or nurse care line. Based on your situation, they will determine when you should go in for an exam. This information is not intended to replace advice given to you by your health care provider. Make sure you discuss  any questions you have with your health care provider. Document Revised: 08/24/2020 Document Reviewed: 08/24/2020 Elsevier Patient Education  2021 Elsevier Inc.  
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
## 2021-01-03 NOTE — MAU Note (Signed)
.  Chelsea Thomas is a 25 y.o. at [redacted]w[redacted]d here in MAU reporting: She had an appointment in the office an hour ago. Her cervix was checked and states she has been cramping ever since. Denies VB or LOF. Reports no fetal movement since her cervical exam. Was 1cm.

## 2021-01-03 NOTE — MAU Provider Note (Addendum)
History     CSN: [id number]  Arrival date and time: 01/03/21 1451   Event Date/Time   First Provider Initiated Contact with Patient 01/03/21 1529      Chief Complaint  Patient presents with  . Contractions   HPI Chelsea Thomas is a 25 y.o. G1P0 at [redacted]w[redacted]d who presents with contractions & decreased fetal movement. Was in the office at 1 pm & had her cervix checked. Since then has had continued abdominal cramping and has not felt baby move at all since leaving the office.  Denies LOF or vaginal bleeding.  Is scheduled for induction on Monday. Goes to [facility].   OB History    Gravida  1   Para      Term      Preterm      AB      Living        SAB      IAB      Ectopic      Multiple      Live Births              Past Medical History:  Diagnosis Date  . Anxiety   . Attention deficit disorder   . Depression   . Eating disorder    Pt. reports   . GERD (gastroesophageal reflux disease)   . Headache(784.0)   . Medical history non-contributory   . Scoliosis   . Urinary tract infection     History reviewed. No pertinent surgical history.  Family History  Problem Relation Age of Onset  . Cancer Maternal Grandfather   . Diabetes Maternal Grandfather   . Hypertension Maternal Grandfather   . Diabetes Cousin   . Hypertension Cousin   . Depression Mother   . Mental illness Mother   . Osteoporosis Mother   . Drug abuse Father   . Asperger's syndrome Brother   . Scoliosis Brother     Social History   Tobacco Use  . Smoking status: Former Smoker    Packs/day: 0.15    Years: 2.00    Pack years: 0.30    Types: Cigarettes  . Smokeless tobacco: Never Used  Vaping Use  . Vaping Use: Never used  Substance Use Topics  . Alcohol use: Not Currently    Alcohol/week: 0.0 standard drinks    Comment: socially  . Drug use: Not Currently    Types: Marijuana    Allergies: No Known Allergies  Medications Prior to Admission  Medication Sig  Dispense Refill Last Dose  . FLUoxetine (PROZAC) 40 MG capsule Take 40 mg by mouth daily.     . QUEtiapine (SEROQUEL) 100 MG tablet Take 100 mg by mouth at bedtime.     . valACYclovir (VALTREX) 1000 MG tablet Take 1,000 mg by mouth 2 (two) times daily.       Review of Systems  Constitutional: Negative.   Gastrointestinal: Positive for abdominal pain.  Genitourinary: Negative.    Physical Exam   Blood pressure 122/74, pulse (!) 113, temperature 98.6 F (37 C), temperature source Oral, resp. rate 15, SpO2 100 %.  Physical Exam Vitals and nursing note reviewed.  Constitutional:      General: She is not in acute distress.    Appearance: Normal appearance.  HENT:     Head: Normocephalic and atraumatic.  Abdominal:     Tenderness: There is no abdominal tenderness.  Neurological:     Mental Status: She is alert.  Psychiatric:  Mood and Affect: Mood normal.        Behavior: Behavior normal.    NST:  Baseline: 150 bpm, Variability: Good {> 6 bpm), Accelerations: Reactive and Decelerations: Absent  Ctx Q3-6 minutes  MAU Course  Procedures No results found for this or any previous visit (from the past 24 hour(s)).  MDM  Patient present for labor evaluation. Cervix unchanged while here.   Reports no fetal movement x 2 hours prior to arrival. While in MAU documented 13 movements in less than 1 hour and has a reactive tracing. Patient reassured.    Assessment and Plan   1. False labor   2. Decreased fetal movements in third trimester, single or unspecified fetus   3. NST (non-stress test) reactive   4. [redacted] weeks gestation of pregnancy    -discussed reasons to return to MAU -keep scheduled induction appointment  Chelsea Thomas 01/03/2021, 3:29 PM

## 2021-01-04 DIAGNOSIS — Z3A39 39 weeks gestation of pregnancy: Secondary | ICD-10-CM

## 2021-01-04 DIAGNOSIS — R519 Headache, unspecified: Secondary | ICD-10-CM

## 2021-01-04 NOTE — Discharge Instructions (Signed)
Fetal Movement Counts Patient Name: ________________________________________________ Patient Due Date: ____________________  What is a fetal movement count? A fetal movement count is the number of times that you feel your baby move during a certain amount of time. This may also be called a fetal kick count. A fetal movement count is recommended for every pregnant woman. You may be asked to start counting fetal movements as early as week 28 of your pregnancy. Pay attention to when your baby is most active. You may notice your baby's sleep and wake cycles. You may also notice things that make your baby move more. You should do a fetal movement count:  When your baby is normally most active.  At the same time each day. A good time to count movements is while you are resting, after having something to eat and drink. How do I count fetal movements? 1. Find a quiet, comfortable area. Sit, or lie down on your side. 2. Write down the date, the start time and stop time, and the number of movements that you felt between those two times. Take this information with you to your health care visits. 3. Write down your start time when you feel the first movement. 4. Count kicks, flutters, swishes, rolls, and jabs. You should feel at least 10 movements. 5. You may stop counting after you have felt 10 movements, or if you have been counting for 2 hours. Write down the stop time. 6. If you do not feel 10 movements in 2 hours, contact your health care provider for further instructions. Your health care provider may want to do additional tests to assess your baby's well-being. Contact a health care provider if:  You feel fewer than 10 movements in 2 hours.  Your baby is not moving like he or she usually does. Date: ____________ Start time: ____________ Stop time: ____________ Movements: ____________ Date: ____________ Start time: ____________ Stop time: ____________ Movements: ____________ Date: ____________  Start time: ____________ Stop time: ____________ Movements: ____________ Date: ____________ Start time: ____________ Stop time: ____________ Movements: ____________ Date: ____________ Start time: ____________ Stop time: ____________ Movements: ____________ Date: ____________ Start time: ____________ Stop time: ____________ Movements: ____________ Date: ____________ Start time: ____________ Stop time: ____________ Movements: ____________ Date: ____________ Start time: ____________ Stop time: ____________ Movements: ____________ Date: ____________ Start time: ____________ Stop time: ____________ Movements: ____________ This information is not intended to replace advice given to you by your health care provider. Make sure you discuss any questions you have with your health care provider. Document Revised: 06/22/2019 Document Reviewed: 06/22/2019 Elsevier Patient Education  2021 Elsevier Inc.  

## 2021-01-05 ENCOUNTER — Other Ambulatory Visit: Payer: Self-pay

## 2021-01-05 ENCOUNTER — Encounter (HOSPITAL_COMMUNITY): Payer: Self-pay | Admitting: Obstetrics & Gynecology

## 2021-01-05 ENCOUNTER — Inpatient Hospital Stay (EMERGENCY_DEPARTMENT_HOSPITAL)
Admission: AD | Admit: 2021-01-05 | Discharge: 2021-01-05 | Disposition: A | Discharge status: Home / Self Care | Payer: Medicaid Other | Source: Home / Self Care | Attending: Obstetrics & Gynecology | Admitting: Obstetrics & Gynecology

## 2021-01-05 DIAGNOSIS — Z3A39 39 weeks gestation of pregnancy: Secondary | ICD-10-CM

## 2021-01-05 DIAGNOSIS — O471 False labor at or after 37 completed weeks of gestation: Secondary | ICD-10-CM

## 2021-01-05 DIAGNOSIS — Z3689 Encounter for other specified antenatal screening: Secondary | ICD-10-CM

## 2021-01-05 DIAGNOSIS — O479 False labor, unspecified: Secondary | ICD-10-CM

## 2021-01-05 NOTE — MAU Provider Note (Signed)
S: Ms. Chelsea Thomas is a 25 y.o. G1P0 at [redacted]w[redacted]d  who presents to MAU today for labor evaluation.     Cervical exam by RN: no cervical change over the course of 1.5 hours  Dilation: Fingertip Effacement (%): Thick Cervical Position: Posterior Station: -3 Exam by:: Leo Rod, RNC  Fetal Monitoring: Baseline: 140 Variability: moderate Accelerations: present  Decelerations: none Contractions: irregular mild contractions   MDM Discussed patient with RN. NST reviewed.   A: SIUP at [redacted]w[redacted]d  False labor  P: Discharge home Labor precautions and kick counts included in AVS Patient to [facility]  Patient may return to MAU as needed or when in labor   Sharyon Cable, [place] 01/05/2021 6:21 AM

## 2021-01-05 NOTE — MAU Note (Signed)
Pt reports stronger contractions, denies bleeding or ROM. Reports good fetal movement

## 2021-01-05 NOTE — Discharge Instructions (Signed)

## 2021-01-06 ENCOUNTER — Inpatient Hospital Stay (HOSPITAL_COMMUNITY)
Admission: AD | Admit: 2021-01-06 | Discharge: 2021-01-08 | DRG: 788 | Disposition: A | Discharge status: Home / Self Care | Payer: Medicaid Other | Attending: Obstetrics and Gynecology | Admitting: Obstetrics and Gynecology

## 2021-01-06 ENCOUNTER — Encounter (HOSPITAL_COMMUNITY): Payer: Self-pay | Admitting: Anesthesiology

## 2021-01-06 ENCOUNTER — Encounter (HOSPITAL_COMMUNITY): Payer: Self-pay | Admitting: Obstetrics & Gynecology

## 2021-01-06 ENCOUNTER — Inpatient Hospital Stay (HOSPITAL_COMMUNITY): Payer: Medicaid Other | Admitting: Anesthesiology

## 2021-01-06 ENCOUNTER — Other Ambulatory Visit: Payer: Self-pay

## 2021-01-06 ENCOUNTER — Encounter (HOSPITAL_COMMUNITY)
Admission: AD | Disposition: A | Discharge status: Home / Self Care | Payer: Self-pay | Source: Home / Self Care | Attending: Obstetrics and Gynecology

## 2021-01-06 DIAGNOSIS — F419 Anxiety disorder, unspecified: Secondary | ICD-10-CM | POA: Diagnosis present

## 2021-01-06 DIAGNOSIS — F32A Depression, unspecified: Secondary | ICD-10-CM | POA: Diagnosis present

## 2021-01-06 DIAGNOSIS — O471 False labor at or after 37 completed weeks of gestation: Secondary | ICD-10-CM | POA: Diagnosis not present

## 2021-01-06 DIAGNOSIS — O99344 Other mental disorders complicating childbirth: Secondary | ICD-10-CM | POA: Diagnosis present

## 2021-01-06 DIAGNOSIS — A6000 Herpesviral infection of urogenital system, unspecified: Secondary | ICD-10-CM | POA: Diagnosis present

## 2021-01-06 DIAGNOSIS — O9832 Other infections with a predominantly sexual mode of transmission complicating childbirth: Principal | ICD-10-CM | POA: Diagnosis present

## 2021-01-06 DIAGNOSIS — Z87891 Personal history of nicotine dependence: Secondary | ICD-10-CM | POA: Diagnosis not present

## 2021-01-06 DIAGNOSIS — Z3A39 39 weeks gestation of pregnancy: Secondary | ICD-10-CM | POA: Diagnosis not present

## 2021-01-06 DIAGNOSIS — O26893 Other specified pregnancy related conditions, third trimester: Secondary | ICD-10-CM | POA: Diagnosis present

## 2021-01-06 DIAGNOSIS — Z20822 Contact with and (suspected) exposure to covid-19: Secondary | ICD-10-CM | POA: Diagnosis present

## 2021-01-06 DIAGNOSIS — Z3A40 40 weeks gestation of pregnancy: Secondary | ICD-10-CM | POA: Diagnosis not present

## 2021-01-06 LAB — TYPE AND SCREEN
ABO/RH(D): A NEG
Antibody Screen: NEGATIVE

## 2021-01-06 LAB — CBC
HCT: 33.3 % — ABNORMAL LOW (ref 36.0–46.0)
Hemoglobin: 10.6 g/dL — ABNORMAL LOW (ref 12.0–15.0)
MCH: 25.1 pg — ABNORMAL LOW (ref 26.0–34.0)
MCHC: 31.8 g/dL (ref 30.0–36.0)
MCV: 78.9 fL — ABNORMAL LOW (ref 80.0–100.0)
Platelets: 292 10*3/uL (ref 150–400)
RBC: 4.22 MIL/uL (ref 3.87–5.11)
RDW: 15.2 % (ref 11.5–15.5)
WBC: 14.2 10*3/uL — ABNORMAL HIGH (ref 4.0–10.5)
nRBC: 0 % (ref 0.0–0.2)

## 2021-01-06 LAB — RESP PANEL BY RT-PCR (FLU A&B, COVID) ARPGX2
Influenza A by PCR: NEGATIVE
Influenza B by PCR: NEGATIVE
SARS Coronavirus 2 by RT PCR: NEGATIVE

## 2021-01-06 LAB — RPR: RPR Ser Ql: NONREACTIVE

## 2021-01-06 SURGERY — Surgical Case
Anesthesia: Epidural

## 2021-01-06 MED ORDER — WITCH HAZEL-GLYCERIN EX PADS: 1.0000 "application " | MEDICATED_PAD | CUTANEOUS | Status: DC | PRN

## 2021-01-06 MED ORDER — OXYCODONE-ACETAMINOPHEN 5-325 MG PO TABS: 2.0000 | ORAL_TABLET | ORAL | Status: DC | PRN

## 2021-01-06 MED ORDER — MORPHINE SULFATE (PF) 0.5 MG/ML IJ SOLN
INTRAMUSCULAR | Status: AC
  Filled 2021-01-06: qty 10

## 2021-01-06 MED ORDER — FENTANYL CITRATE (PF) 100 MCG/2ML IJ SOLN
INTRAMUSCULAR | Status: AC
  Filled 2021-01-06: qty 2

## 2021-01-06 MED ORDER — DIPHENHYDRAMINE HCL 50 MG/ML IJ SOLN: 12.5000 mg | INTRAMUSCULAR | Status: DC | PRN

## 2021-01-06 MED ORDER — HYDROMORPHONE HCL 1 MG/ML IJ SOLN: 0.2000 mg | INTRAMUSCULAR | Status: DC | PRN

## 2021-01-06 MED ORDER — PHENYLEPHRINE 40 MCG/ML (10ML) SYRINGE FOR IV PUSH (FOR BLOOD PRESSURE SUPPORT): 80.0000 ug | PREFILLED_SYRINGE | INTRAVENOUS | Status: DC | PRN

## 2021-01-06 MED ORDER — DIBUCAINE (PERIANAL) 1 % EX OINT: 1.0000 "application " | TOPICAL_OINTMENT | CUTANEOUS | Status: DC | PRN

## 2021-01-06 MED ORDER — QUETIAPINE FUMARATE 100 MG PO TABS
100.0000 mg | ORAL_TABLET | Freq: Every day | ORAL | Status: DC
  Filled 2021-01-06: qty 4
  Filled 2021-01-06 (×3): qty 1

## 2021-01-06 MED ORDER — NALBUPHINE HCL 10 MG/ML IJ SOLN: 5.0000 mg | Freq: Once | INTRAMUSCULAR | Status: DC | PRN

## 2021-01-06 MED ORDER — KETOROLAC TROMETHAMINE 30 MG/ML IJ SOLN
30.0000 mg | Freq: Once | INTRAMUSCULAR | Status: AC | PRN
  Administered 2021-01-06: 30 mg via INTRAVENOUS

## 2021-01-06 MED ORDER — ZOLPIDEM TARTRATE 5 MG PO TABS: 5.0000 mg | ORAL_TABLET | Freq: Every evening | ORAL | Status: DC | PRN

## 2021-01-06 MED ORDER — FENTANYL-BUPIVACAINE-NACL 0.5-0.125-0.9 MG/250ML-% EP SOLN
EPIDURAL | Status: DC | PRN
  Administered 2021-01-06: 12 mL/h via EPIDURAL

## 2021-01-06 MED ORDER — HYDROMORPHONE HCL 1 MG/ML IJ SOLN: 0.2500 mg | INTRAMUSCULAR | Status: DC | PRN

## 2021-01-06 MED ORDER — KETAMINE HCL 50 MG/5ML IJ SOSY
PREFILLED_SYRINGE | INTRAMUSCULAR | Status: AC
  Filled 2021-01-06: qty 5

## 2021-01-06 MED ORDER — DEXMEDETOMIDINE (PRECEDEX) IN NS 20 MCG/5ML (4 MCG/ML) IV SYRINGE
PREFILLED_SYRINGE | INTRAVENOUS | Status: AC
  Filled 2021-01-06: qty 5

## 2021-01-06 MED ORDER — DEXAMETHASONE SODIUM PHOSPHATE 10 MG/ML IJ SOLN
INTRAMUSCULAR | Status: DC | PRN
  Administered 2021-01-06: 4 mg via INTRAVENOUS

## 2021-01-06 MED ORDER — SIMETHICONE 80 MG PO CHEW
80.0000 mg | CHEWABLE_TABLET | Freq: Three times a day (TID) | ORAL | Status: DC
  Administered 2021-01-06 – 2021-01-07 (×4): 80 mg via ORAL
  Filled 2021-01-06 (×6): qty 1

## 2021-01-06 MED ORDER — ONDANSETRON HCL 4 MG/2ML IJ SOLN: 4.0000 mg | Freq: Once | INTRAMUSCULAR | Status: DC | PRN

## 2021-01-06 MED ORDER — LACTATED RINGERS IV SOLN: 500.0000 mL | Freq: Once | INTRAVENOUS | Status: DC

## 2021-01-06 MED ORDER — LIDOCAINE HCL (PF) 1 % IJ SOLN
INTRAMUSCULAR | Status: DC | PRN
  Administered 2021-01-06: 5 mL via EPIDURAL

## 2021-01-06 MED ORDER — SENNOSIDES-DOCUSATE SODIUM 8.6-50 MG PO TABS
2.0000 | ORAL_TABLET | Freq: Every day | ORAL | Status: DC
  Administered 2021-01-07 – 2021-01-08 (×2): 2 via ORAL
  Filled 2021-01-06 (×3): qty 2

## 2021-01-06 MED ORDER — COCONUT OIL OIL
1.0000 "application " | TOPICAL_OIL | Status: DC | PRN
  Filled 2021-01-06: qty 120

## 2021-01-06 MED ORDER — FENTANYL-BUPIVACAINE-NACL 0.5-0.125-0.9 MG/250ML-% EP SOLN
12.0000 mL/h | EPIDURAL | Status: DC | PRN
  Filled 2021-01-06: qty 250

## 2021-01-06 MED ORDER — OXYTOCIN BOLUS FROM INFUSION: 333.0000 mL | Freq: Once | INTRAVENOUS | Status: DC

## 2021-01-06 MED ORDER — LACTATED RINGERS IV SOLN: 500.0000 mL | INTRAVENOUS | Status: DC | PRN

## 2021-01-06 MED ORDER — FLUOXETINE HCL 20 MG PO CAPS
40.0000 mg | ORAL_CAPSULE | Freq: Every day | ORAL | Status: DC
  Administered 2021-01-06 – 2021-01-08 (×3): 40 mg via ORAL
  Filled 2021-01-06 (×3): qty 2

## 2021-01-06 MED ORDER — OXYTOCIN-SODIUM CHLORIDE 30-0.9 UT/500ML-% IV SOLN
1.0000 m[IU]/min | INTRAVENOUS | Status: DC
  Administered 2021-01-06: 2 m[IU]/min via INTRAVENOUS
  Filled 2021-01-06: qty 500

## 2021-01-06 MED ORDER — FENTANYL CITRATE (PF) 100 MCG/2ML IJ SOLN
INTRAMUSCULAR | Status: DC | PRN
  Administered 2021-01-06: 100 ug via EPIDURAL

## 2021-01-06 MED ORDER — OXYTOCIN-SODIUM CHLORIDE 30-0.9 UT/500ML-% IV SOLN
INTRAVENOUS | Status: DC | PRN
  Administered 2021-01-06: 300 mL via INTRAVENOUS

## 2021-01-06 MED ORDER — ONDANSETRON HCL 4 MG/2ML IJ SOLN: 4.0000 mg | Freq: Three times a day (TID) | INTRAMUSCULAR | Status: DC | PRN

## 2021-01-06 MED ORDER — TETANUS-DIPHTH-ACELL PERTUSSIS 5-2.5-18.5 LF-MCG/0.5 IM SUSY
0.5000 mL | PREFILLED_SYRINGE | Freq: Once | INTRAMUSCULAR | Status: DC
  Filled 2021-01-06: qty 0.5

## 2021-01-06 MED ORDER — OXYTOCIN-SODIUM CHLORIDE 30-0.9 UT/500ML-% IV SOLN: 2.5000 [IU]/h | INTRAVENOUS | Status: AC

## 2021-01-06 MED ORDER — FLEET ENEMA 7-19 GM/118ML RE ENEM: 1.0000 | ENEMA | Freq: Every day | RECTAL | Status: DC | PRN

## 2021-01-06 MED ORDER — VALACYCLOVIR HCL 500 MG PO TABS
1000.0000 mg | ORAL_TABLET | Freq: Two times a day (BID) | ORAL | Status: DC
Start: 2021-01-06 — End: 2021-01-08
  Administered 2021-01-07 – 2021-01-08 (×5): 1000 mg via ORAL
  Filled 2021-01-06 (×5): qty 2

## 2021-01-06 MED ORDER — FENTANYL CITRATE (PF) 100 MCG/2ML IJ SOLN
100.0000 ug | INTRAMUSCULAR | Status: DC | PRN
  Administered 2021-01-06: 100 ug via INTRAVENOUS
  Filled 2021-01-06: qty 2

## 2021-01-06 MED ORDER — NALOXONE HCL 4 MG/10ML IJ SOLN
1.0000 ug/kg/h | INTRAMUSCULAR | Status: DC | PRN
  Filled 2021-01-06: qty 5

## 2021-01-06 MED ORDER — FENTANYL CITRATE (PF) 100 MCG/2ML IJ SOLN
INTRAMUSCULAR | Status: AC
  Administered 2021-01-06: 100 ug via INTRAVENOUS
  Filled 2021-01-06: qty 2

## 2021-01-06 MED ORDER — DIPHENHYDRAMINE HCL 25 MG PO CAPS
25.0000 mg | ORAL_CAPSULE | Freq: Four times a day (QID) | ORAL | Status: DC | PRN
Start: 2021-01-06 — End: 2021-01-08

## 2021-01-06 MED ORDER — SODIUM CHLORIDE 0.9 % IR SOLN
Status: DC | PRN
  Administered 2021-01-06: 1

## 2021-01-06 MED ORDER — CEFAZOLIN SODIUM-DEXTROSE 2-4 GM/100ML-% IV SOLN
INTRAVENOUS | Status: AC
  Filled 2021-01-06: qty 100

## 2021-01-06 MED ORDER — LIDOCAINE HCL (PF) 1 % IJ SOLN
30.0000 mL | INTRAMUSCULAR | Status: DC | PRN
  Filled 2021-01-06: qty 30

## 2021-01-06 MED ORDER — NALBUPHINE HCL 10 MG/ML IJ SOLN
5.0000 mg | Freq: Once | INTRAMUSCULAR | Status: DC | PRN
Start: 2021-01-06 — End: 2021-01-08

## 2021-01-06 MED ORDER — ACETAMINOPHEN 325 MG PO TABS: 650.0000 mg | ORAL_TABLET | ORAL | Status: DC | PRN

## 2021-01-06 MED ORDER — OXYTOCIN-SODIUM CHLORIDE 30-0.9 UT/500ML-% IV SOLN: 2.5000 [IU]/h | INTRAVENOUS | Status: DC

## 2021-01-06 MED ORDER — MORPHINE SULFATE (PF) 0.5 MG/ML IJ SOLN
INTRAMUSCULAR | Status: DC | PRN
  Administered 2021-01-06: 3 mg via EPIDURAL

## 2021-01-06 MED ORDER — EPHEDRINE 5 MG/ML INJ: 10.0000 mg | INTRAVENOUS | Status: DC | PRN

## 2021-01-06 MED ORDER — OXYCODONE-ACETAMINOPHEN 5-325 MG PO TABS
1.0000 | ORAL_TABLET | ORAL | Status: DC | PRN
Start: 2021-01-06 — End: 2021-01-06

## 2021-01-06 MED ORDER — ONDANSETRON HCL 4 MG/2ML IJ SOLN
INTRAMUSCULAR | Status: DC | PRN
  Administered 2021-01-06: 4 mg via INTRAVENOUS

## 2021-01-06 MED ORDER — LACTATED RINGERS IV SOLN: INTRAVENOUS | Status: DC | PRN

## 2021-01-06 MED ORDER — SODIUM CHLORIDE 0.9% FLUSH: 3.0000 mL | INTRAVENOUS | Status: DC | PRN

## 2021-01-06 MED ORDER — DEXMEDETOMIDINE HCL IN NACL 200 MCG/50ML IV SOLN
INTRAVENOUS | Status: DC | PRN
  Administered 2021-01-06 (×2): 8 ug via INTRAVENOUS

## 2021-01-06 MED ORDER — OXYTOCIN-SODIUM CHLORIDE 30-0.9 UT/500ML-% IV SOLN
INTRAVENOUS | Status: AC
  Filled 2021-01-06: qty 500

## 2021-01-06 MED ORDER — ONDANSETRON HCL 4 MG/2ML IJ SOLN: 4.0000 mg | Freq: Four times a day (QID) | INTRAMUSCULAR | Status: DC | PRN

## 2021-01-06 MED ORDER — SIMETHICONE 80 MG PO CHEW
80.0000 mg | CHEWABLE_TABLET | ORAL | Status: DC | PRN
  Filled 2021-01-06: qty 1

## 2021-01-06 MED ORDER — LACTATED RINGERS IV SOLN: INTRAVENOUS | Status: DC

## 2021-01-06 MED ORDER — OXYCODONE HCL 5 MG/5ML PO SOLN: 5.0000 mg | Freq: Once | ORAL | Status: DC | PRN

## 2021-01-06 MED ORDER — NALBUPHINE HCL 10 MG/ML IJ SOLN: 5.0000 mg | INTRAMUSCULAR | Status: DC | PRN

## 2021-01-06 MED ORDER — CHLOROPROCAINE HCL (PF) 3 % IJ SOLN
INTRAMUSCULAR | Status: AC
  Filled 2021-01-06: qty 20

## 2021-01-06 MED ORDER — NALBUPHINE HCL 10 MG/ML IJ SOLN
5.0000 mg | INTRAMUSCULAR | Status: DC | PRN
  Administered 2021-01-07: 5 mg via INTRAVENOUS
  Filled 2021-01-06: qty 1

## 2021-01-06 MED ORDER — MENTHOL 3 MG MT LOZG
1.0000 | LOZENGE | OROMUCOSAL | Status: DC | PRN
  Filled 2021-01-06: qty 9

## 2021-01-06 MED ORDER — ACETAMINOPHEN 500 MG PO TABS
1000.0000 mg | ORAL_TABLET | Freq: Four times a day (QID) | ORAL | Status: DC
  Administered 2021-01-06 – 2021-01-08 (×7): 1000 mg via ORAL
  Filled 2021-01-06 (×8): qty 2

## 2021-01-06 MED ORDER — DIPHENHYDRAMINE HCL 25 MG PO CAPS: 25.0000 mg | ORAL_CAPSULE | ORAL | Status: DC | PRN

## 2021-01-06 MED ORDER — FENTANYL CITRATE (PF) 100 MCG/2ML IJ SOLN
INTRAMUSCULAR | Status: DC | PRN
Start: 2021-01-06 — End: 2021-01-06
  Administered 2021-01-06: 50 ug via INTRAVENOUS

## 2021-01-06 MED ORDER — PRENATAL MULTIVITAMIN CH
1.0000 | ORAL_TABLET | Freq: Every day | ORAL | Status: DC
  Administered 2021-01-07: 1 via ORAL
  Filled 2021-01-06 (×2): qty 1

## 2021-01-06 MED ORDER — SODIUM BICARBONATE 8.4 % IV SOLN
INTRAVENOUS | Status: AC
  Filled 2021-01-06: qty 50

## 2021-01-06 MED ORDER — VALACYCLOVIR HCL 500 MG PO TABS
500.0000 mg | ORAL_TABLET | Freq: Every day | ORAL | Status: DC
  Administered 2021-01-06: 500 mg via ORAL
  Filled 2021-01-06: qty 1

## 2021-01-06 MED ORDER — SOD CITRATE-CITRIC ACID 500-334 MG/5ML PO SOLN
30.0000 mL | ORAL | Status: DC | PRN
  Administered 2021-01-06: 30 mL via ORAL
  Filled 2021-01-06: qty 15

## 2021-01-06 MED ORDER — ONDANSETRON HCL 4 MG/2ML IJ SOLN
INTRAMUSCULAR | Status: AC
  Filled 2021-01-06: qty 2

## 2021-01-06 MED ORDER — CHLOROPROCAINE HCL (PF) 3 % IJ SOLN
INTRAMUSCULAR | Status: DC | PRN
  Administered 2021-01-06: 3 mL
  Administered 2021-01-06: 17 mL

## 2021-01-06 MED ORDER — TERBUTALINE SULFATE 1 MG/ML IJ SOLN: 0.2500 mg | Freq: Once | INTRAMUSCULAR | Status: DC | PRN

## 2021-01-06 MED ORDER — SCOPOLAMINE 1 MG/3DAYS TD PT72
MEDICATED_PATCH | TRANSDERMAL | Status: AC
  Filled 2021-01-06: qty 1

## 2021-01-06 MED ORDER — OXYCODONE HCL 5 MG PO TABS
5.0000 mg | ORAL_TABLET | ORAL | Status: DC | PRN
  Administered 2021-01-07: 10 mg via ORAL
  Administered 2021-01-07 – 2021-01-08 (×2): 5 mg via ORAL
  Filled 2021-01-06: qty 2
  Filled 2021-01-06 (×2): qty 1

## 2021-01-06 MED ORDER — OXYCODONE HCL 5 MG PO TABS: 5.0000 mg | ORAL_TABLET | Freq: Once | ORAL | Status: DC | PRN

## 2021-01-06 MED ORDER — SCOPOLAMINE 1 MG/3DAYS TD PT72
1.0000 | MEDICATED_PATCH | Freq: Once | TRANSDERMAL | Status: DC
  Administered 2021-01-06: 1.5 mg via TRANSDERMAL

## 2021-01-06 MED ORDER — NALOXONE HCL 0.4 MG/ML IJ SOLN: 0.4000 mg | INTRAMUSCULAR | Status: DC | PRN

## 2021-01-06 MED ORDER — KETOROLAC TROMETHAMINE 30 MG/ML IJ SOLN
INTRAMUSCULAR | Status: AC
  Filled 2021-01-06: qty 1

## 2021-01-06 SURGICAL SUPPLY — 37 items
ADH SKN CLS APL DERMABOND .7 (GAUZE/BANDAGES/DRESSINGS)
APL SKNCLS STERI-STRIP NONHPOA (GAUZE/BANDAGES/DRESSINGS) ×1
BENZOIN TINCTURE PRP APPL 2/3 (GAUZE/BANDAGES/DRESSINGS) ×1 IMPLANT
CHLORAPREP W/TINT 26ML (MISCELLANEOUS) ×2 IMPLANT
CLAMP CORD UMBIL (MISCELLANEOUS) IMPLANT
CLOSURE STERI STRIP 1/2 X4 (GAUZE/BANDAGES/DRESSINGS) ×1 IMPLANT
CLOTH BEACON ORANGE TIMEOUT ST (SAFETY) ×2 IMPLANT
DERMABOND ADVANCED (GAUZE/BANDAGES/DRESSINGS)
DERMABOND ADVANCED .7 DNX12 (GAUZE/BANDAGES/DRESSINGS) IMPLANT
DRSG OPSITE POSTOP 4X10 (GAUZE/BANDAGES/DRESSINGS) ×2 IMPLANT
ELECT REM PT RETURN 9FT ADLT (ELECTROSURGICAL) ×2
ELECTRODE REM PT RTRN 9FT ADLT (ELECTROSURGICAL) ×1 IMPLANT
EXTRACTOR VACUUM M CUP 4 TUBE (SUCTIONS) IMPLANT
GLOVE BIO SURGEON STRL SZ7.5 (GLOVE) ×2 IMPLANT
GLOVE BIOGEL PI IND STRL 7.0 (GLOVE) ×1 IMPLANT
GLOVE BIOGEL PI INDICATOR 7.0 (GLOVE) ×1
GOWN STRL REUS W/TWL LRG LVL3 (GOWN DISPOSABLE) ×4 IMPLANT
KIT ABG SYR 3ML LUER SLIP (SYRINGE) ×2 IMPLANT
NDL HYPO 25X5/8 SAFETYGLIDE (NEEDLE) ×1 IMPLANT
NEEDLE HYPO 25X5/8 SAFETYGLIDE (NEEDLE) ×2 IMPLANT
NS IRRIG 1000ML POUR BTL (IV SOLUTION) ×2 IMPLANT
PACK C SECTION WH (CUSTOM PROCEDURE TRAY) ×2 IMPLANT
PAD ABD DERMACEA PRESS 5X9 (GAUZE/BANDAGES/DRESSINGS) ×2 IMPLANT
PAD OB MATERNITY 4.3X12.25 (PERSONAL CARE ITEMS) ×2 IMPLANT
PENCIL SMOKE EVAC W/HOLSTER (ELECTROSURGICAL) ×2 IMPLANT
STRIP CLOSURE SKIN 1/2X4 (GAUZE/BANDAGES/DRESSINGS) IMPLANT
SUT MNCRL 0 VIOLET CTX 36 (SUTURE) ×4 IMPLANT
SUT MONOCRYL 0 CTX 36 (SUTURE) ×8
SUT PDS AB 0 CTX 60 (SUTURE) ×2 IMPLANT
SUT PLAIN 0 NONE (SUTURE) IMPLANT
SUT PLAIN 2 0 (SUTURE)
SUT PLAIN 2 0 XLH (SUTURE) IMPLANT
SUT PLAIN ABS 2-0 CT1 27XMFL (SUTURE) IMPLANT
SUT VIC AB 4-0 KS 27 (SUTURE) ×2 IMPLANT
TOWEL OR 17X24 6PK STRL BLUE (TOWEL DISPOSABLE) ×2 IMPLANT
TRAY FOLEY W/BAG SLVR 14FR LF (SET/KITS/TRAYS/PACK) ×2 IMPLANT
WATER STERILE IRR 1000ML POUR (IV SOLUTION) ×2 IMPLANT

## 2021-01-06 NOTE — Progress Notes (Signed)
RUBELLA IMMUNE 05-21-20

## 2021-01-06 NOTE — Anesthesia Postprocedure Evaluation (Signed)
Anesthesia Post Note  Patient: Ashlynn Gunnels  Procedure(s) Performed: CESAREAN SECTION (N/A )     Patient location during evaluation: Mother Baby Anesthesia Type: Epidural Level of consciousness: oriented and awake and alert Pain management: pain level controlled Vital Signs Assessment: post-procedure vital signs reviewed and stable Respiratory status: spontaneous breathing and respiratory function stable Cardiovascular status: blood pressure returned to baseline and stable Postop Assessment: no headache, no backache, no apparent nausea or vomiting and able to ambulate Anesthetic complications: no   No complications documented.  Last Vitals:  Vitals:   01/06/21 1520 01/06/21 1611  BP: 113/76 (!) 93/57  Pulse: 74 75  Resp: 15 16  Temp: 37 C 36.9 C  SpO2: 100% 100%    Last Pain:  Vitals:   01/06/21 1520  TempSrc:   PainSc: 2    Pain Goal:                   Trevor Iha

## 2021-01-06 NOTE — Anesthesia Preprocedure Evaluation (Signed)
Anesthesia Evaluation  Patient identified by MRN, date of birth, ID band Patient awake    Reviewed: Allergy & Precautions, NPO status , Patient's Chart, lab work & pertinent test results  Airway Mallampati: II  TM Distance: >3 FB Neck ROM: Full    Dental no notable dental hx. (+) Teeth Intact, Dental Advisory Given   Pulmonary former smoker,    Pulmonary exam normal breath sounds clear to auscultation       Cardiovascular Exercise Tolerance: Good negative cardio ROS Normal cardiovascular exam Rhythm:Regular Rate:Normal     Neuro/Psych  Headaches, PSYCHIATRIC DISORDERS Anxiety Depression Bipolar Disorder    GI/Hepatic Neg liver ROS, GERD  ,  Endo/Other  negative endocrine ROS  Renal/GU negative Renal ROS     Musculoskeletal   Abdominal   Peds  Hematology Lab Results      Component                Value               Date                      WBC                      14.2 (H)            01/06/2021                HGB                      10.6 (L)            01/06/2021                HCT                      33.3 (L)            01/06/2021                MCV                      78.9 (L)            01/06/2021                PLT                      292                 01/06/2021              Anesthesia Other Findings   Reproductive/Obstetrics (+) Pregnancy                             Anesthesia Physical Anesthesia Plan  ASA: II  Anesthesia Plan: Epidural   Post-op Pain Management:    Induction:   PONV Risk Score and Plan:   Airway Management Planned:   Additional Equipment:   Intra-op Plan:   Post-operative Plan:   Informed Consent: I have reviewed the patients History and Physical, chart, labs and discussed the procedure including the risks, benefits and alternatives for the proposed anesthesia with the patient or authorized representative who has indicated his/her  understanding and acceptance.       Plan Discussed with:   Anesthesia Plan Comments: (40 Wk G1P0 for LEA)  Anesthesia Quick Evaluation  

## 2021-01-06 NOTE — H&P (Signed)
Chelsea Thomas is a 25 y.o. female presenting for UCs followed by [facility] in MAU. Pregnancy complicated by GC+ and chlamydia+-both treated and TOC done. 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 OB History    Gravida  1   Para      Term      Preterm      AB      Living        SAB      IAB      Ectopic      Multiple      Live Births             Past Medical History:  Diagnosis Date  . Anxiety   . Attention deficit disorder   . Depression   . Eating disorder    Pt. reports   . GERD (gastroesophageal reflux disease)   . Headache(784.0)   . Medical history non-contributory   . Scoliosis   . Urinary tract infection    History reviewed. No pertinent surgical history. Family History: family history includes Asperger's syndrome in her brother; Cancer in her maternal grandfather; Depression in her mother; Diabetes in her cousin and maternal grandfather; Drug abuse in her father; Hypertension in her cousin and maternal grandfather; Mental illness in her mother; Osteoporosis in her mother; Scoliosis in her brother. Social History:  reports that she has quit smoking. Her smoking use included cigarettes. She has a 0.30 pack-year smoking history. She has never used smokeless tobacco. She reports previous alcohol use. She reports previous drug use. Drug: Marijuana.     Maternal Diabetes: No Genetic Screening: Normal Maternal Ultrasounds/Referrals: Normal Fetal Ultrasounds or other Referrals:  None Maternal Substance Abuse:  No Significant Maternal Medications:  Meds include: Prozac Other: Seroquel, Valtrex Significant Maternal Lab Results:  Group B Strep negative Other Comments:  None  Review of Systems  Constitutional: Negative for fever.  Eyes: Negative for visual disturbance.  Gastrointestinal: Negative for abdominal pain.  Neurological: Negative for headaches.   Maternal Medical  History:  Fetal activity: Perceived fetal activity is normal.      Dilation: 2 Effacement (%): 90 Station: -2 Exam by:: Anisten Tomassi Blood pressure 128/68, pulse 79, temperature 98.4 F (36.9 C), temperature source Oral, resp. rate 20, SpO2 100 %. Maternal Exam:  Abdomen: Fetal presentation: vertex     Fetal Exam Fetal State Assessment: Category I - tracings are normal.     Physical Exam Cardiovascular:     Rate and Rhythm: Normal rate.  Pulmonary:     Effort: Pulmonary effort is normal.     Cx 2/90/-2/vtx UCs q3-5 min  Prenatal labs: ABO, Rh: --/--/PENDING (02/21 0745) Antibody: PENDING (02/21 0745) Rubella:   RPR:    HBsAg: Negative (01/31 0000)  HIV: Non-reactive (11/18 0000)  GBS: Negative/-- (01/26 0000)   Assessment/Plan: 25 yo G1P0 40 0/7 wks Entering active labor S/P fentanyl IV x 1 Requests  pain medication>epidural prn Check cervix after epidural. If no change will consider pitocin augmentation. D/W patient . She states she understands and agrees. Roselle Locus II 01/06/2021, 8:37 AM

## 2021-01-06 NOTE — Op Note (Signed)
NAMEMELAINE, MCPHEE MEDICAL RECORD [id number] ACCOUNT [id number] DATE OF BIRTH:01/16/1996 FACILITY: [facility] LOCATION: [id number] PHYSICIAN:Lavita Pontius E. Jaece Ducharme II, MD  OPERATIVE REPORT  DATE OF PROCEDURE:  01/06/2021  PREOPERATIVE DIAGNOSIS:  Nonreassuring fetal heart tracing.  POSTOPERATIVE DIAGNOSIS:  Nonreassuring fetal heart tracing.  PROCEDURE:  Emergent low transverse cesarean section.  SURGEON:  Harold Hedge II, MD  ANESTHESIA:  Epidural, Dr. Richardson Landry.  ESTIMATED BLOOD LOSS:  418 mL.  FINDINGS:  Viable female infant.  Apgars, arterial cord pH, and birth weight pending.  SPECIMENS:  Placenta to pathology.  INDICATIONS AND CONSENT:  This patient is a 25 year old, G1, P0, who presents with spontaneous rupture of membranes entering active phase of labor.  Cervix was 2 cm dilated, 90% effaced, -2 station, and vertex.  She has an epidural placed.  She is on  Pitocin.  She had had intermittent late decels, but none that were sustained.  Cervical exam revealed no change, and rupture of forebag for a scant amount of clear fluid was done.  With that position change, the baby's heartbeat was in the 70s to the  90s.  We changed position from left to right and finally knee-chest.  Pitocin was discontinued, IV fluids were opened up, and the baby did not respond.  Therefore, emergent cesarean section was recommended.  Fetal scalp electrode had been placed by this  point as well.  DESCRIPTION OF PROCEDURE:  The patient was taken to the operating room in emergent fashion.  She was placed on the OR table in the dorsal supine position with a left lateral wedge.  Fetal heartbeat was noted to be 120s at that point. Cervical exam is unchanged.  Scalp lead was  removed.  Foley catheter was already in place.  She was prepped with Betadine and draped in a sterile fashion.  Timeout was undertaken.  After testing for adequate epidural anesthesia, skin was entered through a Pfannenstiel incision and  dissection was  carried out in layers to the peritoneum, which was entered and extended bluntly.  Vesicouterine peritoneum was taken down cephalad laterally.  Uterus was incised in a low transverse manner.  The uterine cavity was entered bluntly with a hemostat.  The  lower edge of the anterior placenta was encountered with this.  The vertex was delivered with the aid of a Kiwi vacuum.  Nuchal cord x1 was noted and reduced.  There was good cry and tone noted on delivery.  After 1 minute, the cord was clamped and cut,  and the baby was handed to waiting pediatrics team.  Placenta was manually delivered and sent to pathology.  Uterus was cleaned.  Uterus was closed in 2 running locking imbricating layers of 0 Monocryl suture, which achieved good hemostasis.  Lavage was  carried out.  Anterior peritoneum was closed carefully with 0 Monocryl suture in a running fashion, which was also used to reapproximate the pyramidalis muscle in the midline.  Anterior rectus fascia was closed in a running fashion with a 0 looped PDS.   Subcutaneous layer was closed with interrupted 2-0 plain, and the skin was closed in a subcuticular fashion with 4-0 Vicryl on a Keith needle.  Benzoin, Steri-Strips, honeycomb pressure dressing was applied.  All counts were correct.  The patient was  taken to the recovery room in stable condition.  IN/NUANCE  D:01/06/2021 T:01/06/2021 JOB:[id number]

## 2021-01-06 NOTE — Transfer of Care (Signed)
Immediate Anesthesia Transfer of Care Note  Patient: Chelsea Thomas  Procedure(s) Performed: CESAREAN SECTION (N/A )  Patient Location: PACU  Anesthesia Type:Epidural  Level of Consciousness: awake  Airway & Oxygen Therapy: Patient Spontanous Breathing  Post-op Assessment: Report given to RN  Post vital signs: Reviewed and stable  Last Vitals:  Vitals Value Taken Time  BP    Temp    Pulse 102 01/06/21 1400  Resp 18 01/06/21 1400  SpO2 100 % 01/06/21 1400  Vitals shown include unvalidated device data.  Last Pain:  Vitals:   01/06/21 1200  TempSrc: Axillary         Complications: No complications documented.

## 2021-01-06 NOTE — Progress Notes (Signed)
VAST consulted to obtain IV access. Upon arrival at bedside, patient with IV access in place. No further access needed at this time.

## 2021-01-06 NOTE — MAU Note (Signed)
...  Chelsea Thomas is a 25 y.o. at [redacted]w[redacted]d here in MAU reporting: CTX that increased in intensity around 2300 last night. CTX currently 3-5 minutes apart. Patient sent home from MAU yesterday at 0.5cm. +FM. No VB.   Pain score: 8/10 lower abdominal and lower back pain FHT:135 initial

## 2021-01-06 NOTE — Anesthesia Procedure Notes (Signed)
Epidural Patient location during procedure: OB Start time: 01/06/2021 9:12 AM End time: 01/06/2021 9:25 AM  Staffing Anesthesiologist: Trevor Iha, MD Performed: anesthesiologist   Preanesthetic Checklist Completed: patient identified, IV checked, site marked, risks and benefits discussed, surgical consent, monitors and equipment checked, pre-op evaluation and timeout performed  Epidural Patient position: sitting Prep: DuraPrep and site prepped and draped Patient monitoring: continuous pulse ox and blood pressure Approach: midline Location: L3-L4 Injection technique: LOR air  Needle:  Needle type: Tuohy  Needle gauge: 17 G Needle length: 9 cm and 9 Needle insertion depth: 6 cm Catheter type: closed end flexible Catheter size: 19 Gauge Catheter at skin depth: 11 cm Test dose: negative  Assessment Events: blood not aspirated, injection not painful, no injection resistance, no paresthesia and negative IV test  Additional Notes Patient identified. Risks/Benefits/Options discussed with patient including but not limited to bleeding, infection, nerve damage, paralysis, failed block, incomplete pain control, headache, blood pressure changes, nausea, vomiting, reactions to medication both or allergic, itching and postpartum back pain. Confirmed with bedside nurse the patient's most recent platelet count. Confirmed with patient that they are not currently taking any anticoagulation, have any bleeding history or any family history of bleeding disorders. Patient expressed understanding and wished to proceed. All questions were answered. Sterile technique was used throughout the entire procedure. Please see nursing notes for vital signs. Test dose was given through epidural needle and negative prior to continuing to dose epidural or start infusion. Warning signs of high block given to the patient including shortness of breath, tingling/numbness in hands, complete motor block, or any  concerning symptoms with instructions to call for help. Patient was given instructions on fall risk and not to get out of bed. All questions and concerns addressed with instructions to call with any issues. 1 Attempt (S) . Patient tolerated procedure well.

## 2021-01-06 NOTE — Lactation Note (Signed)
This note was copied from a baby's chart. Lactation Consultation Note  Patient Name: Chelsea Thomas Date: 01/06/2021 Reason for consult: Initial assessment;Term;Primapara;1st time breastfeeding Age:25 hours  Initial visit to 8 hours old infant of a P1 mother. Infant is latched to right breast cradle position upon arrival. Noted a shallow latch, lack of support pillows, sub-optimal position. Mother requests assistance to improve latch and positioning.   Talked to mother about hand expression and demonstrated technique. Colostrum easily expressed, collected ~38mL and spoonfed infant.   Set up support pillows for football position to right breast. Demonstrated alignment. Latched infant with ease. Infant sucking rhythmically with flanged lips, observed swallowing and breast tissue moving. Mother denies pain or discomfort.   Reviewed with mother average size of a NB stomach. Talked about infant's hunger and fullness cues. Reviewed newborn behavior and expectations during first days of life.   Infant had a stool, LC changed diaper.    Plan: 1-Breastfeeding on demand, ensuring a deep, comfortable latch.  2-Offer breast 8-12 times in 24h period to establish good milk supply. 3-Undressing infant and place skin to skin when ready to breastfeed 4-Keep infant awake during breastfeeding session: massaging breast, infant's hand/shoulder/feet 5-Monitor voids and stools as signs good intake.  6-Encouraged maternal rest, hydration and food intake.  7-Contact LC as needed for feeds/support/concerns/questions   All questions answered at this time. Provided Lactation services brochure and promoted [employer] booklet information.   Maternal Data Has patient been taught Hand Expression?: Yes Does the patient have breastfeeding experience prior to this delivery?: No  Feeding Mother's Current Feeding Choice: Breast Milk  LATCH Score Latch: Grasps breast easily, tongue down, lips flanged, rhythmical  sucking.  Audible Swallowing: Spontaneous and intermittent  Type of Nipple: Everted at rest and after stimulation  Comfort (Breast/Nipple): Soft / non-tender  Hold (Positioning): Assistance needed to correctly position infant at breast and maintain latch.  LATCH Score: 9   Interventions Interventions: Breast feeding basics reviewed;Assisted with latch;Skin to skin;Breast massage;Hand express;Adjust position;Expressed milk;Position options;Support pillows;Education  Discharge WIC Program: Yes  Consult Status Consult Status: Follow-up Date: 01/07/21 Follow-up type: In-patient    Chelsea Thomas 01/06/2021, 9:58 PM

## 2021-01-06 NOTE — Brief Op Note (Signed)
01/06/2021  1:44 PM  PATIENT:  Chelsea Thomas  25 y.o. female  PRE-OPERATIVE DIAGNOSIS:  STAT C/S for bradycardia  POST-OPERATIVE DIAGNOSIS:  STAT C/S for bradycardia   PROCEDURE:  Procedure(s): CESAREAN SECTION (N/A)  SURGEON:  Surgeon(s) and Role:    * Harold Hedge, MD - Primary  PHYSICIAN ASSISTANT:   ASSISTANTS: none   ANESTHESIA:   epidural  EBL:  418 ml   BLOOD ADMINISTERED:none  DRAINS: Urinary Catheter (Foley)   LOCAL MEDICATIONS USED:  NONE  SPECIMEN:  Source of Specimen:  placenta  DISPOSITION OF SPECIMEN:  PATHOLOGY  COUNTS:  YES  TOURNIQUET:  * No tourniquets in log *  DICTATION: .Other Dictation: Dictation Number [id number]  PLAN OF CARE: Admit to inpatient   PATIENT DISPOSITION:  PACU - hemodynamically stable.   Delay start of Pharmacological VTE agent (>24hrs) due to surgical blood loss or risk of bleeding: not applicable

## 2021-01-07 ENCOUNTER — Encounter (HOSPITAL_COMMUNITY): Payer: Self-pay | Admitting: Obstetrics and Gynecology

## 2021-01-07 ENCOUNTER — Inpatient Hospital Stay (HOSPITAL_COMMUNITY)
Admission: AD | Admit: 2021-01-07 | Payer: Medicaid Other | Source: Home / Self Care | Admitting: Obstetrics and Gynecology

## 2021-01-07 ENCOUNTER — Inpatient Hospital Stay (HOSPITAL_COMMUNITY): Payer: Medicaid Other

## 2021-01-07 LAB — CBC
HCT: 25.6 % — ABNORMAL LOW (ref 36.0–46.0)
Hemoglobin: 8.2 g/dL — ABNORMAL LOW (ref 12.0–15.0)
MCH: 25.6 pg — ABNORMAL LOW (ref 26.0–34.0)
MCHC: 32 g/dL (ref 30.0–36.0)
MCV: 80 fL (ref 80.0–100.0)
Platelets: 279 10*3/uL (ref 150–400)
RBC: 3.2 MIL/uL — ABNORMAL LOW (ref 3.87–5.11)
RDW: 15.1 % (ref 11.5–15.5)
WBC: 20.3 10*3/uL — ABNORMAL HIGH (ref 4.0–10.5)
nRBC: 0 % (ref 0.0–0.2)

## 2021-01-07 MED ORDER — RHO D IMMUNE GLOBULIN 1500 UNIT/2ML IJ SOSY
300.0000 ug | PREFILLED_SYRINGE | Freq: Once | INTRAMUSCULAR | Status: AC
  Administered 2021-01-07: 300 ug via INTRAVENOUS
  Filled 2021-01-07: qty 2

## 2021-01-07 MED ORDER — IBUPROFEN 600 MG PO TABS
600.0000 mg | ORAL_TABLET | Freq: Four times a day (QID) | ORAL | Status: DC
  Administered 2021-01-07 – 2021-01-08 (×5): 600 mg via ORAL
  Filled 2021-01-07 (×5): qty 1

## 2021-01-07 NOTE — Social Work (Signed)
CSW attempted to meet with MOB to complete assessment. Lactation was observed bedside. CSW will follow-up.  Bran Aldridge, MSW, LCSWA Clinical Social Work [facility] [phone] 

## 2021-01-07 NOTE — Lactation Note (Signed)
This note was copied from a baby's chart. Lactation Consultation Note  Patient Name: Chelsea Thomas KZSWF'U Date: 01/07/2021 Reason for consult: Follow-up assessment;Term;Primapara;1st time breastfeeding Age:25 hours   P1 mother whose infant is now 78 hours old.  This is a term baby at 40+0 weeks.  Mother was interested in having my assistance to help awaken and latch her baby.  Reviewed breast feeding basics for this first time mother.  Observed her doing hand expression and she was able to express colostrum drops which I finger fed to baby.  Colostrum container provided and milk storage times reviewed.  Finger feeding demonstrated.    Positioned mother appropriately with good pillow support and assisted baby to latch in the football hold on the right breast.  Baby had a wide gape and flanged lips.  Showed mother how to obtain and maintain a good deep latch.  Mother denied pain with feeding.  Observed her feeding for 16 minutes while reviewing many breast feeding concepts.  Demonstrated breast compressions and mother was able to return demonstrate.  Occasional swallows noted.  Encouraged to feed 8-12 times/24 hours or sooner if baby shows feeding cues.  Reviewed cues.  Continue to review basic concepts with mother.  She is very receptive to learning and eager to do a good job breast feeding.  Her support person is her mother.  Grandmother is present and also receptive to learning.    Mother was planning to breast feed for only 2 weeks due to going back on her Adderall.  Since baby seems to latch and feed well, mother is encouraged and may want to breast feed now.  I made a copy of [employer] on this medication and presented it to mother and grandmother.  I suggested they speak with mother's OB doctor and her psychologist to determine if mother can breast feed on this medication.  It is classified as an L-3.  Family very appreciative.  Mother is a [facility] participant in [place].  She has yet to determine if she will accept the breast feeding or the formula feeding package.  I suggested she contact WIC to discuss after her decision has been made.  Mother verbalized understanding.  RN updated.    Maternal Data    Feeding Mother's Current Feeding Choice: Breast Milk  LATCH Score Latch: Repeated attempts needed to sustain latch, nipple held in mouth throughout feeding, stimulation needed to elicit sucking reflex.  Audible Swallowing: A few with stimulation  Type of Nipple: Everted at rest and after stimulation  Comfort (Breast/Nipple): Soft / non-tender  Hold (Positioning): Assistance needed to correctly position infant at breast and maintain latch.  LATCH Score: 7   Lactation Tools Discussed/Used    Interventions Interventions: Breast feeding basics reviewed;Assisted with latch;Skin to skin;Breast massage;Hand express;Breast compression;Adjust position;Coconut oil;Position options;Support pillows;Education  Discharge Pump: Personal (Uncertain as to whether or not she will take the formula package or the breast feeding package)  Consult Status Consult Status: Follow-up Date: 01/08/21 Follow-up type: In-patient    Kiptyn Rafuse R Derryck Shahan 01/07/2021, 11:02 AM
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
## 2021-01-08 LAB — RH IG WORKUP (INCLUDES ABO/RH)
ABO/RH(D): A NEG
Fetal Screen: NEGATIVE
Gestational Age(Wks): 40
Unit division: 0

## 2021-01-08 LAB — SURGICAL PATHOLOGY

## 2021-01-08 MED ORDER — IBUPROFEN 600 MG PO TABS: 600.0000 mg | ORAL_TABLET | Freq: Four times a day (QID) | ORAL | 0 refills | Status: AC

## 2021-01-08 MED ORDER — OXYCODONE HCL 5 MG PO TABS: 5.0000 mg | ORAL_TABLET | ORAL | 0 refills | Status: AC | PRN

## 2021-01-08 NOTE — Discharge Summary (Signed)
Postpartum Discharge Summary       Patient Name: Chelsea Thomas DOB: [birth year] MRN: [id number]  Date of admission: 01/06/2021 Delivery date:01/06/2021  Delivering provider: Everlene Farrier  Date of discharge: 01/08/2021  Admitting diagnosis: Normal labor [O80, Z37.9] Cesarean delivery delivered [O82] Intrauterine pregnancy: [redacted]w[redacted]d     Secondary diagnosis:  Active Problems:   Normal labor   Cesarean delivery delivered  Additional problems:      Discharge diagnosis: Term Pregnancy Delivered                                              Post partum procedures:  Augmentation: AROM and Pitocin Complications: None  Hospital course: Onset of Labor With Unplanned C/S   25 y.o. yo G1P1001 at [redacted]w[redacted]d was admitted in [place] on 01/06/2021. Patient had a labor course significant for nonreassuring FHR. The patient went for cesarean section due to Non-Reassuring FHR. Delivery details as follows: Membrane Rupture Time/Date: 6:03 AM ,01/06/2021   Delivery Method:C-Section, Vacuum Assisted  Details of operation can be found in separate operative note. Patient had an uncomplicated postpartum course.  She is ambulating,tolerating a regular diet, passing flatus, and urinating well.  Patient is discharged home in stable condition 01/08/21.  Newborn Data: Birth date:01/06/2021  Birth time:1:11 PM  Gender:Female  Living status:Living  Apgars:6 ,9  Weight:3025 g   Magnesium Sulfate received: No BMZ received: No Rhophylac:N/A MMR:N/A T-DaP:Given prenatally Flu: Yes Transfusion:No  Physical exam  Vitals:   01/07/21 0530 01/07/21 1414 01/07/21 2148 01/08/21 0500  BP: 117/61 (!) 104/54 131/73 128/82  Pulse: 77 67 (!) 101 98  Resp: $Remo'18 18 20 18  'VLrNY$ Temp: 99.2 F (37.3 C) (!) 97.5 F (36.4 C) 97.8 F (36.6 C) 98.2 F (36.8 C)  TempSrc: Oral Oral Oral Oral  SpO2: 99%  99% 98%   General: alert, cooperative and no distress Lochia: appropriate Uterine Fundus: firm Incision: Healing well  with no significant drainage DVT Evaluation: No evidence of DVT seen on physical exam. Labs: Lab Results  Component Value Date   WBC 20.3 (H) 01/07/2021   HGB 8.2 (L) 01/07/2021   HCT 25.6 (L) 01/07/2021   MCV 80.0 01/07/2021   PLT 279 01/07/2021   CMP Latest Ref Rng & Units 01/03/2021  Glucose 70 - 99 mg/dL 83  BUN 6 - 20 mg/dL <5(L)  Creatinine 0.44 - 1.00 mg/dL 0.53  Sodium 135 - 145 mmol/L 135  Potassium 3.5 - 5.1 mmol/L 3.4(L)  Chloride 98 - 111 mmol/L 107  CO2 22 - 32 mmol/L 17(L)  Calcium 8.9 - 10.3 mg/dL 8.5(L)  Total Protein 6.5 - 8.1 g/dL 6.3(L)  Total Bilirubin 0.3 - 1.2 mg/dL 1.0  Alkaline Phos 38 - 126 U/L 188(H)  AST 15 - 41 U/L 18  ALT 0 - 44 U/L 12   Edinburgh Score: Edinburgh Postnatal Depression Scale Screening Tool 01/07/2021  I have been able to laugh and see the funny side of things. 0  I have looked forward with enjoyment to things. 0  I have blamed myself unnecessarily when things went wrong. 0  I have been anxious or worried for no good reason. 2  I have felt scared or panicky for no good reason. 1  Things have been getting on top of me. 1  I have been so unhappy that I have had difficulty sleeping. 0  I have felt sad or miserable. 1  I have been so unhappy that I have been crying. 1  The thought of harming myself has occurred to me. 0  Edinburgh Postnatal Depression Scale Total 6      After visit meds:  Allergies as of 01/08/2021   No Known Allergies     Medication List    TAKE these medications   FLUoxetine 40 MG capsule Commonly known as: PROZAC Take 40 mg by mouth daily.   ibuprofen 600 MG tablet Commonly known as: ADVIL Take 1 tablet (600 mg total) by mouth every 6 (six) hours.   oxyCODONE 5 MG immediate release tablet Commonly known as: Oxy IR/ROXICODONE Take 1-2 tablets (5-10 mg total) by mouth every 4 (four) hours as needed for moderate pain.   QUEtiapine 100 MG tablet Commonly known as: SEROQUEL Take 100 mg by mouth at  bedtime.   valACYclovir 1000 MG tablet Commonly known as: VALTREX Take 1,000 mg by mouth 2 (two) times daily.        Discharge home in stable condition Infant Feeding: Breast Infant Disposition:home with mother Discharge instruction: per After Visit Summary and Postpartum booklet. Activity: Advance as tolerated. Pelvic rest for 6 weeks.  Diet: routine diet Anticipated Birth Control: Unsure Postpartum Appointment:6 weeks Additional Postpartum F/U: Postpartum Depression checkup and   Future Appointments:No future appointments. Follow up Visit:      01/08/2021 Luz Lex, MD

## 2021-01-08 NOTE — Lactation Note (Signed)
This note was copied from a baby's chart. Lactation Consultation Note  Patient Name: Chelsea Thomas [id number] Date: 01/08/2021 Reason for consult: Follow-up assessment;1st time breastfeeding;Term Age:25 hours  Maternal Data Has patient been taught Hand Expression?: Yes Does the patient have breastfeeding experience prior to this delivery?: No  Feeding Mother's Current Feeding Choice: Breast Milk and Formula   [facility] Student Chelsea Thomas and Chelsea Thomas arrived in the room where mother was in the chair holding infant. Support person was present. [facility] Student asked how breastfeeding was going. Mother stated it was going good and she enjoyed the bonding. She also stated she had been bleeding. [facility] Student reviewed information regarding coconut oil, comfort gels, and expressed milk in regards to bleeding and cracked nipples. LC Student and LC noticed compression stripes on nipples. [facility] Student reviewed proper latch and showed mom a visual resource on latching. Mother was concerned about how often she should feed. Mother was advised by [facility] Student to feed 8-12x in a 24 hour feed. [facility] Student also reviewed the importance of pumping when bottle feeding in order to maintain supply. Mother said she could feel changes in her breast and they were heavier. Mother stated she does not have a pump. 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 Mother was also provided with a manual hand pump and instructed on how to put it together and take it apart. Mother stated she did not have any further questions at this time.   Lactation Tools Discussed/Used  Discussed usage and cleaning of manual pump  Interventions Interventions: Breast feeding basics reviewed;Hand express;Hand pump;Comfort gels;Coconut oil  Discharge Discharge Education: Engorgement and breast care;Warning signs for feeding baby Pump: Manual (Going to call WIC for DEBP) WIC Program: Yes  Consult Status Consult  Status: Complete    Chelsea Thomas 01/08/2021, 10:30 AM

## 2021-02-18 ENCOUNTER — Encounter (HOSPITAL_COMMUNITY): Payer: Self-pay | Admitting: Pharmacy Technician

## 2021-02-18 ENCOUNTER — Emergency Department (HOSPITAL_COMMUNITY): Payer: Medicaid Other

## 2021-02-18 ENCOUNTER — Emergency Department (HOSPITAL_COMMUNITY)
Admission: EM | Admit: 2021-02-18 | Discharge: 2021-02-18 | Disposition: A | Discharge status: Home / Self Care | Payer: Medicaid Other | Attending: Emergency Medicine | Admitting: Emergency Medicine

## 2021-02-18 ENCOUNTER — Other Ambulatory Visit: Payer: Self-pay

## 2021-02-18 DIAGNOSIS — N939 Abnormal uterine and vaginal bleeding, unspecified: Secondary | ICD-10-CM | POA: Diagnosis not present

## 2021-02-18 DIAGNOSIS — R102 Pelvic and perineal pain: Secondary | ICD-10-CM | POA: Diagnosis not present

## 2021-02-18 DIAGNOSIS — Z87891 Personal history of nicotine dependence: Secondary | ICD-10-CM | POA: Diagnosis not present

## 2021-02-18 DIAGNOSIS — R42 Dizziness and giddiness: Secondary | ICD-10-CM | POA: Insufficient documentation

## 2021-02-18 LAB — CBC WITH DIFFERENTIAL/PLATELET
Abs Immature Granulocytes: 0.03 10*3/uL (ref 0.00–0.07)
Basophils Absolute: 0 10*3/uL (ref 0.0–0.1)
Basophils Relative: 0 %
Eosinophils Absolute: 0 10*3/uL (ref 0.0–0.5)
Eosinophils Relative: 0 %
HCT: 34.5 % — ABNORMAL LOW (ref 36.0–46.0)
Hemoglobin: 11 g/dL — ABNORMAL LOW (ref 12.0–15.0)
Immature Granulocytes: 0 %
Lymphocytes Relative: 34 %
Lymphs Abs: 3.2 10*3/uL (ref 0.7–4.0)
MCH: 25.6 pg — ABNORMAL LOW (ref 26.0–34.0)
MCHC: 31.9 g/dL (ref 30.0–36.0)
MCV: 80.4 fL (ref 80.0–100.0)
Monocytes Absolute: 0.8 10*3/uL (ref 0.1–1.0)
Monocytes Relative: 9 %
Neutro Abs: 5.3 10*3/uL (ref 1.7–7.7)
Neutrophils Relative %: 57 %
Platelets: 383 10*3/uL (ref 150–400)
RBC: 4.29 MIL/uL (ref 3.87–5.11)
RDW: 17.7 % — ABNORMAL HIGH (ref 11.5–15.5)
WBC: 9.4 10*3/uL (ref 4.0–10.5)
nRBC: 0 % (ref 0.0–0.2)

## 2021-02-18 LAB — WET PREP, GENITAL
Clue Cells Wet Prep HPF POC: NONE SEEN
Sperm: NONE SEEN
Trich, Wet Prep: NONE SEEN
Yeast Wet Prep HPF POC: NONE SEEN

## 2021-02-18 LAB — URINALYSIS, ROUTINE W REFLEX MICROSCOPIC

## 2021-02-18 LAB — COMPREHENSIVE METABOLIC PANEL
ALT: 16 U/L (ref 0–44)
AST: 21 U/L (ref 15–41)
Albumin: 3.9 g/dL (ref 3.5–5.0)
Alkaline Phosphatase: 80 U/L (ref 38–126)
Anion gap: 9 (ref 5–15)
BUN: 9 mg/dL (ref 6–20)
CO2: 23 mmol/L (ref 22–32)
Calcium: 8.9 mg/dL (ref 8.9–10.3)
Chloride: 108 mmol/L (ref 98–111)
Creatinine, Ser: 0.69 mg/dL (ref 0.44–1.00)
GFR, Estimated: >60 mL/min (ref >60)
Glucose, Bld: 101 mg/dL — ABNORMAL HIGH (ref 70–99)
Potassium: 3.7 mmol/L (ref 3.5–5.1)
Sodium: 140 mmol/L (ref 135–145)
Total Bilirubin: 0.5 mg/dL (ref 0.3–1.2)
Total Protein: 7.1 g/dL (ref 6.5–8.1)

## 2021-02-18 LAB — URINALYSIS, MICROSCOPIC (REFLEX): RBC / HPF: >50 RBC/hpf (ref 0–5)

## 2021-02-18 LAB — LIPASE, BLOOD: Lipase: 32 U/L (ref 11–51)

## 2021-02-18 LAB — I-STAT BETA HCG BLOOD, ED (MC, WL, AP ONLY): I-stat hCG, quantitative: <5 m[IU]/mL (ref <5)

## 2021-02-18 NOTE — ED Triage Notes (Signed)
Emergency Medicine Provider Triage Evaluation Note  Carinna Newhart , a 25 y.o. female  was evaluated in triage.  Pt complains of heavy vaginal bleeding, dizziness, and abdominal pain. Recent c-section on 2/21. History of heavy menses, but was on birth control previously for it with improvement. Vaginal bleeding started 3 days ago and has progressively worsened. 1 pad every hour for the past day. Passing clots. She also admits to dizziness. No chest pain or shortness of breath. She states there is a possibility she could be pregnant.   Review of Systems  Positive: Vaginal bleeding, dizziness, abdominal pain Negative: fever  Physical Exam  There were no vitals taken for this visit. Gen:   Awake, no distress   HEENT:  Atraumatic  Resp:  Normal effort  Cardiac:  Normal rate  Abd:   Nondistended, nontender  MSK:   Moves extremities without difficulty  Neuro:  Speech clear   Medical Decision Making  Medically screening exam initiated at 1:23 PM.  Appropriate orders placed.  Joni Colegrove was informed that the remainder of the evaluation will be completed by another provider, this initial triage assessment does not replace that evaluation, and the importance of remaining in the ED until their evaluation is complete.  Clinical Impression  Heavy vaginal bleeding with dizziness. Concern for symptomatic anemia. Routine labs ordered. If pregnancy test positive, will send to MAU.   Mannie Stabile, [place] 02/18/21 1329

## 2021-02-18 NOTE — ED Notes (Signed)
Reviewed discharge instructions with patient. Follow-up care reviewed. Patient verbalized understanding. Patient A&Ox4, VSS, and ambulatory with steady gait upon discharge.  

## 2021-02-18 NOTE — Discharge Instructions (Signed)
Please follow-up with your OB/GYN within the next week and return to the emergency department for any new or worsening symptoms in the meantime.

## 2021-02-18 NOTE — ED Triage Notes (Signed)
Pt here POV with reports of vag bleeding X3 days. Pt reports today she is going through 1 pad every . Recent csection on 2/21. Pt now reports dizziness.

## 2021-02-18 NOTE — ED Notes (Signed)
Pt transported to [facility] at this time. 

## 2021-02-18 NOTE — ED Provider Notes (Signed)
[facility] EMERGENCY DEPARTMENT Provider Note   CSN: [id number] Arrival date & time: 02/18/21  1318     History Chief Complaint  Patient presents with  . Dizziness  . Vaginal Bleeding    Chelsea Thomas is a 25 y.o. female.  HPI   25 year old female with history of anxiety, ADHD, depression, eating disorder, GERD, headache, scoliosis, who presents to the emergency department today for evaluation of vaginal bleeding.  Patient states that she delivered a baby on 2/21 via C-section.  She had some postdelivery bleeding that resolved however 3 days ago her bleeding resumed.  Initially it was light but it has now become more heavy and she is having to change a pad every hour.  She feels some lightheadedness and some lower pelvic pain as well.  She denies any fevers, vomiting, diarrhea.  Past Medical History:  Diagnosis Date  . Anxiety   . Attention deficit disorder   . Depression   . Eating disorder    Pt. reports   . GERD (gastroesophageal reflux disease)   . Headache(784.0)   . Medical history non-contributory   . Scoliosis   . Urinary tract infection     Patient Active Problem List   Diagnosis Date Noted  . Normal labor 01/06/2021  . Cesarean delivery delivered 01/06/2021  . Polysubstance abuse (HCC) 03/07/2014  . ODD (oppositional defiant disorder) 03/07/2014  . Bipolar I, most recent episode manic, severe (HCC) 03/06/2014  . Suicide attempt by adequate means (HCC) 01/02/2013  . PTSD (post-traumatic stress disorder) 01/02/2013    Past Surgical History:  Procedure Laterality Date  . CESAREAN SECTION N/A 01/06/2021   Procedure: CESAREAN SECTION;  Surgeon: Harold Hedge, MD;  Location: [facility] LD ORS;  Service: Obstetrics;  Laterality: N/A;     OB History    Gravida  1   Para  1   Term  1   Preterm      AB      Living  1     SAB      IAB      Ectopic      Multiple  0   Live Births  1           Family History  Problem Relation  Age of Onset  . Cancer Maternal Grandfather   . Diabetes Maternal Grandfather   . Hypertension Maternal Grandfather   . Diabetes Cousin   . Hypertension Cousin   . Depression Mother   . Mental illness Mother   . Osteoporosis Mother   . Drug abuse Father   . Asperger's syndrome Brother   . Scoliosis Brother     Social History   Tobacco Use  . Smoking status: Former Smoker    Packs/day: 0.15    Years: 2.00    Pack years: 0.30    Types: Cigarettes  . Smokeless tobacco: Never Used  Vaping Use  . Vaping Use: Never used  Substance Use Topics  . Alcohol use: Not Currently    Alcohol/week: 0.0 standard drinks    Comment: socially  . Drug use: Not Currently    Types: Marijuana    Home Medications Prior to Admission medications   Medication Sig Start Date End Date Taking? Authorizing Provider  FLUoxetine (PROZAC) 40 MG capsule Take 40 mg by mouth daily.    [provider]  ibuprofen (ADVIL) 600 MG tablet Take 1 tablet (600 mg total) by mouth every 6 (six) hours. 01/08/21   Candice Camp, MD  oxyCODONE (  OXY IR/ROXICODONE) 5 MG immediate release tablet Take 1-2 tablets (5-10 mg total) by mouth every 4 (four) hours as needed for moderate pain. 01/08/21   Candice CampLowe, David, MD  QUEtiapine (SEROQUEL) 100 MG tablet Take 100 mg by mouth at bedtime.    [provider]  valACYclovir (VALTREX) 1000 MG tablet Take 1,000 mg by mouth 2 (two) times daily.    [provider]    Allergies    Patient has no known allergies.  Review of Systems   Review of Systems  Constitutional: Negative for chills and fever.  HENT: Negative for ear pain and sore throat.   Eyes: Negative for visual disturbance.  Respiratory: Negative for cough and shortness of breath.   Cardiovascular: Negative for chest pain.  Gastrointestinal: Negative for abdominal pain, constipation, diarrhea, nausea and vomiting.  Genitourinary: Positive for pelvic pain and vaginal bleeding. Negative for dysuria,  hematuria and vaginal discharge.  Musculoskeletal: Negative for arthralgias and back pain.  Skin: Negative for rash.  Neurological: Positive for light-headedness. Negative for seizures and syncope.  All other systems reviewed and are negative.   Physical Exam Updated Vital Signs BP 126/64 (BP Location: Right Arm)   Pulse 72   Temp 98.6 F (37 C) (Oral)   Resp 16   SpO2 99%   Physical Exam Vitals and nursing note reviewed.  Constitutional:      General: She is not in acute distress.    Appearance: She is well-developed.  HENT:     Head: Normocephalic and atraumatic.  Eyes:     Conjunctiva/sclera: Conjunctivae normal.  Cardiovascular:     Rate and Rhythm: Normal rate and regular rhythm.     Heart sounds: Normal heart sounds. No murmur heard.   Pulmonary:     Effort: Pulmonary effort is normal. No respiratory distress.     Breath sounds: Normal breath sounds. No wheezing, rhonchi or rales.  Abdominal:     General: Bowel sounds are normal.     Palpations: Abdomen is soft.     Tenderness: There is no abdominal tenderness. There is no guarding or rebound.  Musculoskeletal:     Cervical back: Neck supple.  Skin:    General: Skin is warm and dry.  Neurological:     Mental Status: She is alert.     ED Results / Procedures / Treatments   Labs (all labs ordered are listed, but only abnormal results are displayed) Labs Reviewed  WET PREP, GENITAL - Abnormal; Notable for the following components:      Result Value   WBC, Wet Prep HPF POC FEW (*)    All other components within normal limits  CBC WITH DIFFERENTIAL/PLATELET - Abnormal; Notable for the following components:   Hemoglobin 11.0 (*)    HCT 34.5 (*)    MCH 25.6 (*)    RDW 17.7 (*)    All other components within normal limits  COMPREHENSIVE METABOLIC PANEL - Abnormal; Notable for the following components:   Glucose, Bld 101 (*)    All other components within normal limits  URINALYSIS, ROUTINE W REFLEX  MICROSCOPIC - Abnormal; Notable for the following components:   Color, Urine RED (*)    APPearance TURBID (*)    Glucose, UA   (*)    Value: TEST NOT REPORTED DUE TO COLOR INTERFERENCE OF URINE PIGMENT   Hgb urine dipstick   (*)    Value: TEST NOT REPORTED DUE TO COLOR INTERFERENCE OF URINE PIGMENT   Bilirubin Urine   (*)  Value: TEST NOT REPORTED DUE TO COLOR INTERFERENCE OF URINE PIGMENT   Ketones, ur   (*)    Value: TEST NOT REPORTED DUE TO COLOR INTERFERENCE OF URINE PIGMENT   Protein, ur   (*)    Value: TEST NOT REPORTED DUE TO COLOR INTERFERENCE OF URINE PIGMENT   Nitrite   (*)    Value: TEST NOT REPORTED DUE TO COLOR INTERFERENCE OF URINE PIGMENT   Leukocytes,Ua   (*)    Value: TEST NOT REPORTED DUE TO COLOR INTERFERENCE OF URINE PIGMENT   All other components within normal limits  URINALYSIS, MICROSCOPIC (REFLEX) - Abnormal; Notable for the following components:   Bacteria, UA MANY (*)    All other components within normal limits  URINE CULTURE  LIPASE, BLOOD  I-STAT BETA HCG BLOOD, ED ([facility], [facility], [facility] ONLY)  GC/CHLAMYDIA PROBE AMP () NOT AT [facility]    EKG None  Radiology US PELVIC COMPLETE W TRANSVAGINAL AND TORSION R/O  Result Date: 02/18/2021 CLINICAL DATA:  Vaginal bleeding for 3 days, recent delivery EXAM: TRANSABDOMINAL AND TRANSVAGINAL ULTRASOUND OF PELVIS DOPPLER ULTRASOUND OF OVARIES TECHNIQUE: Both transabdominal and transvaginal ultrasound examinations of the pelvis were performed. Transabdominal technique was performed for global imaging of the pelvis including uterus, ovaries, adnexal regions, and pelvic cul-de-sac. It was necessary to proceed with endovaginal exam following the transabdominal exam to visualize the uterus, endometrium, and RIGHT ovary. Color and duplex Doppler ultrasound was utilized to evaluate blood flow to the ovaries. COMPARISON:  None FINDINGS: Uterus Measurements: 7.6 x 3.4 x 5.7 cm = volume: 78 mL. Anteverted. Normal morphology. Area  of hypoechogenicity in the anterior uterine wall 17 x 7 x 20 mm, may be related to Caesarean section. No additional mass. Endometrium Thickness: 3 mm.  No endometrial fluid or focal abnormality Right ovary Measurements: 2.7 x 2.0 x 2.6 cm = volume: 7.4 mL. Normal morphology without mass. Internal blood flow present on color Doppler imaging. Left ovary Measurements: 2.7 x 1.6 x 1.8 cm = volume: 3.9 mL. Normal morphology without mass. Internal blood flow present on color Doppler imaging. Pulsed Doppler evaluation of both ovaries demonstrates low resistance arterial and venous waveforms in both ovaries. Other findings No free pelvic fluid.  No adnexal masses. IMPRESSION: Focus of abnormal echogenicity at the anterior uterine wall at the lower uterine segment suspect related to Caesarean section. Remainder of exam normal. Electronically Signed   By: Ulyses Southward M.D.   On: 02/18/2021 17:24    Procedures Procedures   Medications Ordered in ED Medications - No data to display  ED Course  I have reviewed the triage vital signs and the nursing notes.  Pertinent labs & imaging results that were available during my care of the patient were reviewed by me and considered in my medical decision making (see chart for details).    MDM Rules/Calculators/A&P                          25 y/o F presents for eval of vaginal bleeding  Reviewed/interpreted labs CBC with mild anemia, otherwise reassuring CMP unremarkable Lipase neg Beta hcg neg UA unable to be interpreted due to vaginal bleeding. Culture was added for further val Wet prep nonconcerning Gc/chlam pending  Pelvic US - Focus of abnormal echogenicity at the anterior uterine wall at the lower uterine segment suspect related to Caesarean section. Remainder of exam normal.  - no evidence of retained products of conception  25 y/o  F with vaginal bleeding. Recent pregnancy. [place] w/o evidence of retained products. This could be regular menses. hgb is  non-concerning at this time and patient is hemodynamically stable.  Feel we can monitor her symptoms and have her follow-up with her OB/GYN.  Have advised that she return to the emergency department for any new or worsening symptoms in the meantime.  She voiced understanding of the plan and reasons to return.  All questions answered.  Patient stable for discharge.  Final Clinical Impression(s) / ED Diagnoses Final diagnoses:  Vaginal bleeding    Rx / DC Orders ED Discharge Orders    None       Rayne Du 02/18/21 1754    Alvira Monday, MD 02/19/21 1209

## 2021-02-19 LAB — GC/CHLAMYDIA PROBE AMP (~~LOC~~) NOT AT ARMC
Chlamydia: NEGATIVE
Comment: NEGATIVE
Comment: NORMAL
Neisseria Gonorrhea: NEGATIVE

## 2021-02-20 LAB — URINE CULTURE

## 2022-01-06 IMAGING — US US PELVIS COMPLETE TRANSABD/TRANSVAG W DUPLEX
1 series · 13 of 25 positions shown · non-contrast
Comparison: None

CLINICAL DATA: Vaginal bleeding for 3 days, recent delivery

EXAM:
TRANSABDOMINAL AND TRANSVAGINAL ULTRASOUND OF PELVIS
DOPPLER ULTRASOUND OF OVARIES
TECHNIQUE: Both transabdominal and transvaginal ultrasound examinations of the
pelvis were performed. Transabdominal technique was performed for
global imaging of the pelvis including uterus, ovaries, adnexal
regions, and pelvic cul-de-sac.
It was necessary to proceed with endovaginal exam following the
transabdominal exam to visualize the uterus, endometrium, and RIGHT
ovary. Color and duplex Doppler ultrasound was utilized to evaluate
blood flow to the ovaries.

[Series 1: us pelvic complete w transvaginal and torsion righ · 13 of 100 slices shown]
[im 1/100]
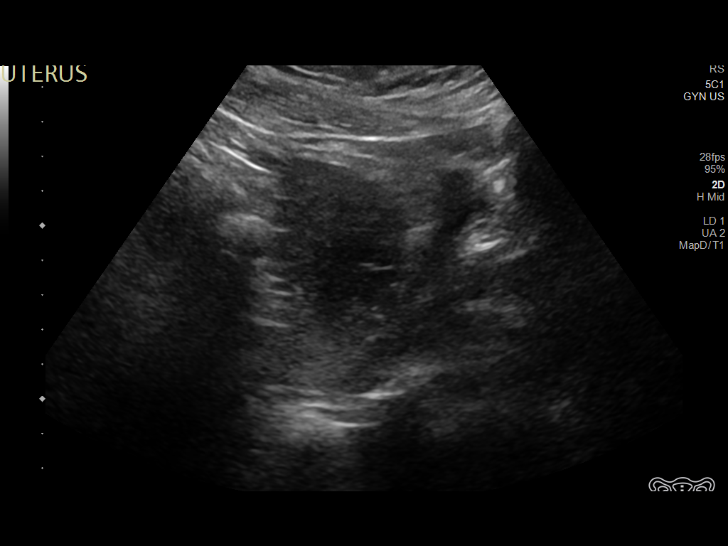
[im 9/100]
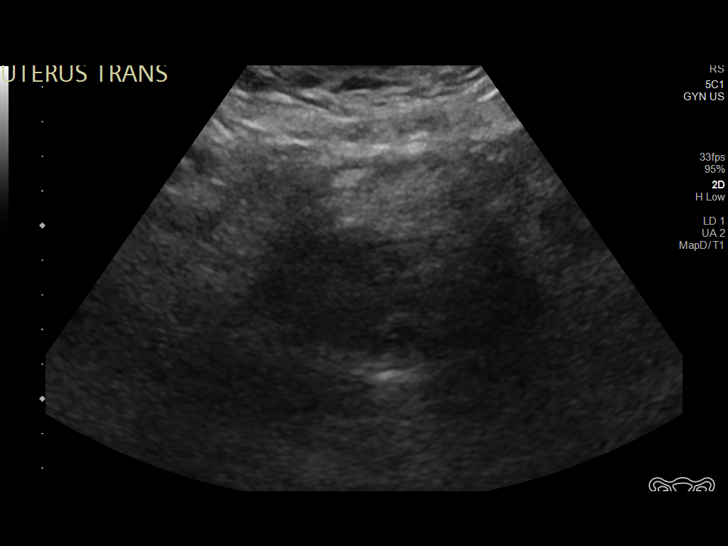
[im 17/100]
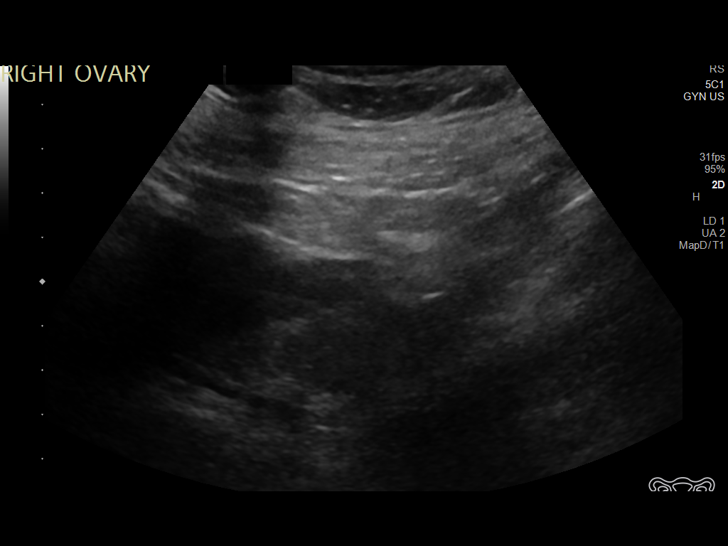
[im 25/100]
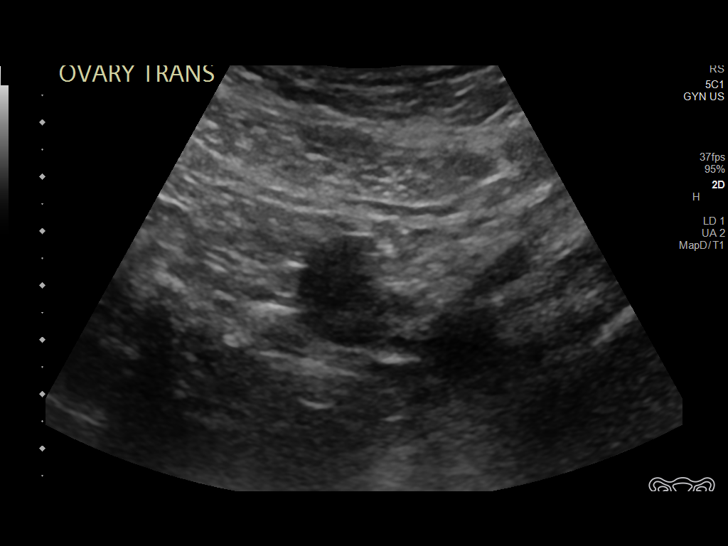
[im 34/100]
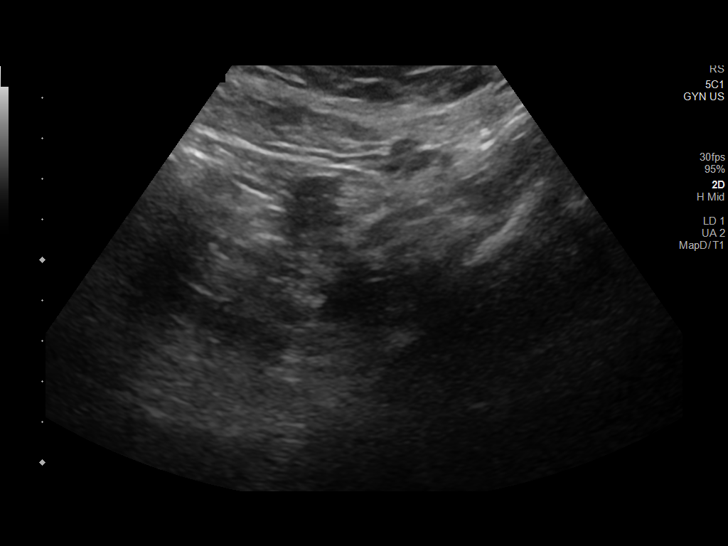
[im 42/100]
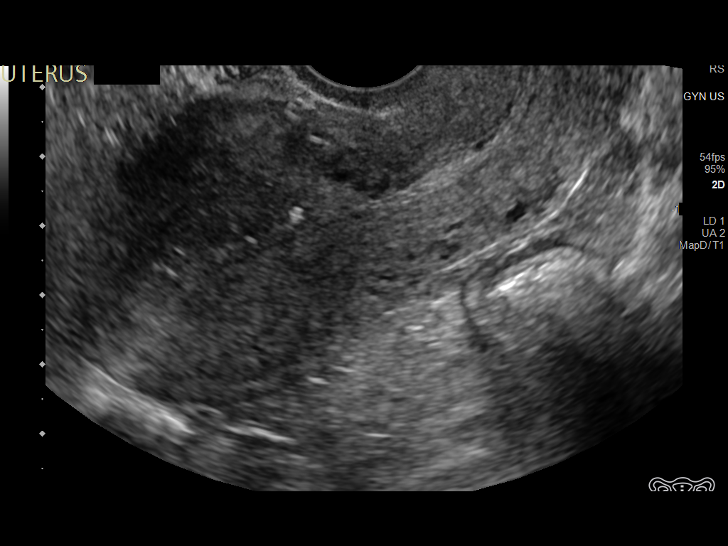
[im 50/100]
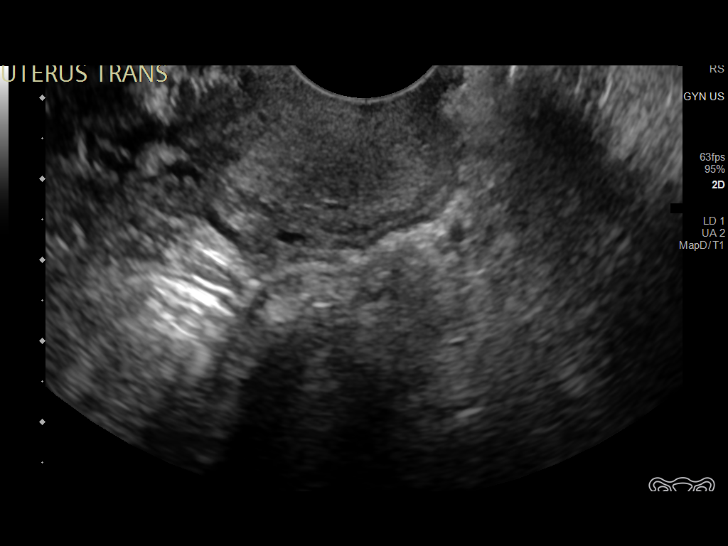
[im 58/100]
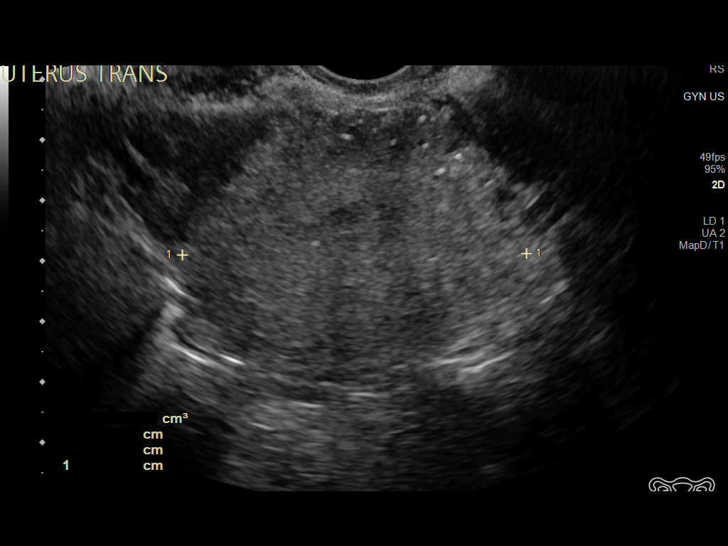
[im 67/100]
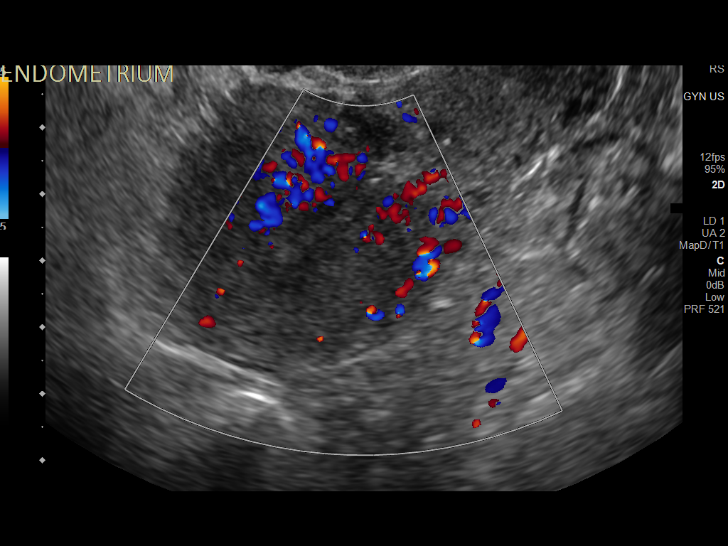
[im 75/100]
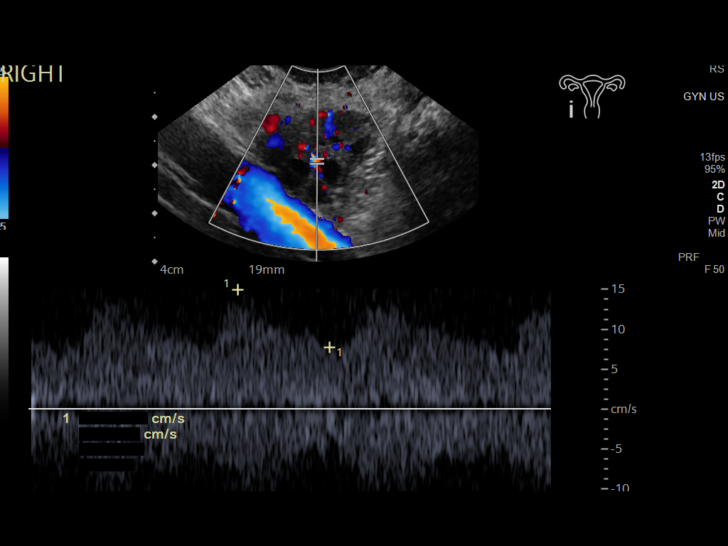
[im 83/100]
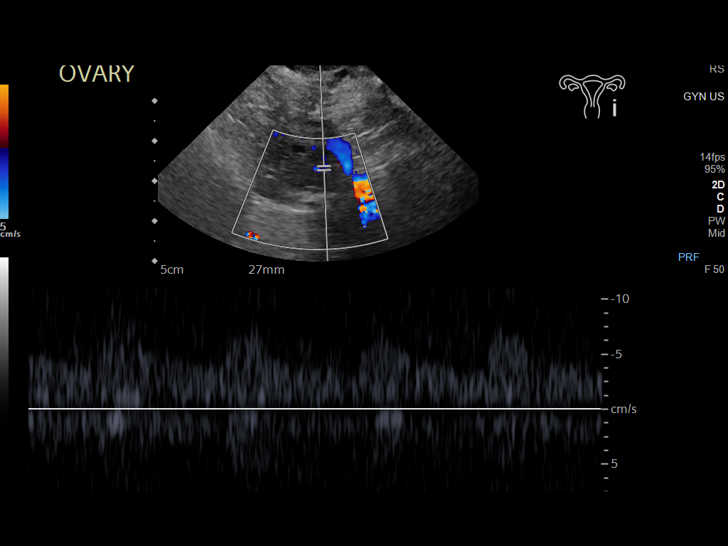
[im 91/100]
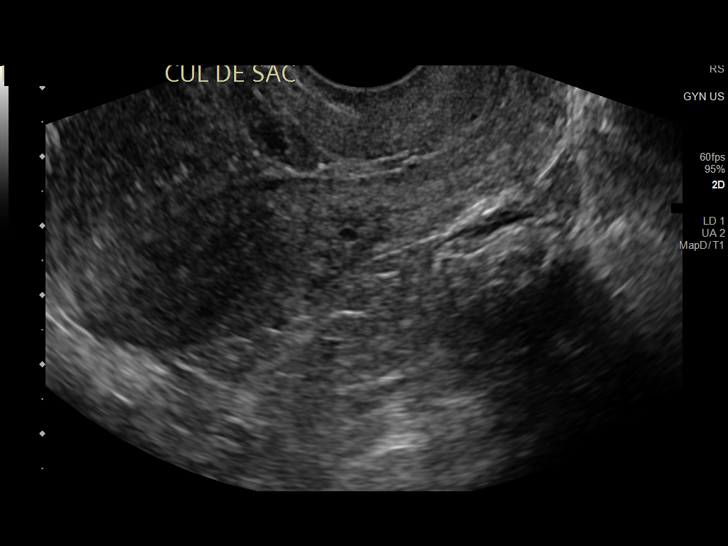
[im 100/100]
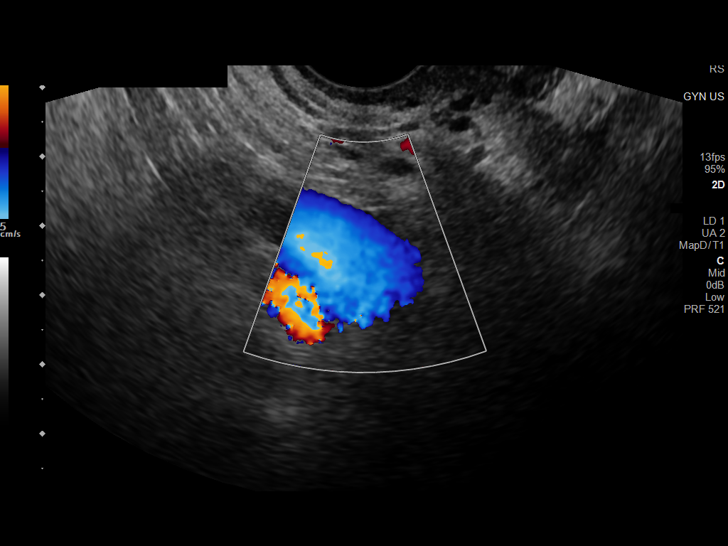

[13 of 25 positions shown; findings below may reference images not displayed]

FINDINGS: Uterus

Measurements: 7.6 x 3.4 x 5.7 cm = volume: 78 mL. Anteverted. Normal
morphology. Area of hypoechogenicity in the anterior uterine wall 17
x 7 x 20 mm, may be related to Caesarean section. No additional
mass.

Endometrium

Thickness: 3 mm.  No endometrial fluid or focal abnormality

Right ovary

Measurements: 2.7 x 2.0 x 2.6 cm = volume: 7.4 mL. Normal morphology
without mass. Internal blood flow present on color Doppler imaging.

Left ovary

Measurements: 2.7 x 1.6 x 1.8 cm = volume: 3.9 mL. Normal morphology
without mass. Internal blood flow present on color Doppler imaging.

Pulsed Doppler evaluation of both ovaries demonstrates low
resistance arterial and venous waveforms in both ovaries.

Other findings

No free pelvic fluid.  No adnexal masses.
IMPRESSION: Focus of abnormal echogenicity at the anterior uterine wall at the
lower uterine segment suspect related to Caesarean section.

Remainder of exam normal.
